# Patient Record
Sex: Female | Born: 1990 | Hispanic: Yes | Marital: Single | Smoking: Never smoker
Health system: Southern US, Community
[De-identification: ages and names within clinical notes are randomized; demographics above are authoritative.]

## PROBLEM LIST (undated history)

## (undated) ENCOUNTER — Emergency Department (HOSPITAL_BASED_OUTPATIENT_CLINIC_OR_DEPARTMENT_OTHER): Admission: EM | Payer: Self-pay | Source: Home / Self Care

## (undated) DIAGNOSIS — Z789 Other specified health status: Secondary | ICD-10-CM

## (undated) DIAGNOSIS — N736 Female pelvic peritoneal adhesions (postinfective): Secondary | ICD-10-CM

## (undated) HISTORY — PX: TUBAL LIGATION: SHX77

---
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
## 2012-11-01 ENCOUNTER — Inpatient Hospital Stay (HOSPITAL_COMMUNITY): Payer: Self-pay

## 2012-11-01 ENCOUNTER — Encounter (HOSPITAL_COMMUNITY): Payer: Self-pay | Admitting: *Deleted

## 2012-11-01 ENCOUNTER — Inpatient Hospital Stay (HOSPITAL_COMMUNITY)
Admission: AD | Admit: 2012-11-01 | Discharge: 2012-11-01 | Disposition: A | Discharge status: Home / Self Care | Payer: MEDICAID | Source: Ambulatory Visit | Attending: Obstetrics & Gynecology | Admitting: Obstetrics & Gynecology

## 2012-11-01 DIAGNOSIS — O26859 Spotting complicating pregnancy, unspecified trimester: Secondary | ICD-10-CM | POA: Insufficient documentation

## 2012-11-01 DIAGNOSIS — O209 Hemorrhage in early pregnancy, unspecified: Secondary | ICD-10-CM

## 2012-11-01 LAB — ABO/RH: ABO/RH(D): O POS

## 2012-11-01 LAB — CBC
MCHC: 32.9 g/dL (ref 30.0–36.0)
Platelets: 202 10*3/uL (ref 150–400)
RDW: 12.9 % (ref 11.5–15.5)
WBC: 10 10*3/uL (ref 4.0–10.5)

## 2012-11-01 LAB — POCT PREGNANCY, URINE: Preg Test, Ur: POSITIVE — AB

## 2012-11-01 LAB — WET PREP, GENITAL: Yeast Wet Prep HPF POC: NONE SEEN

## 2012-11-01 LAB — HCG, QUANTITATIVE, PREGNANCY: hCG, Beta Chain, Quant, S: 41368 m[IU]/mL — ABNORMAL HIGH (ref <5)

## 2012-11-01 NOTE — MAU Provider Note (Signed)
No chief complaint on file.   Subjective Shelley Chen 22 y.o.G3P2002 at [redacted]w[redacted]d by LMP presents with onset today of first episode of red spotting, not enough to wear a pad.  bleeding. Has slight lower abdominal pain. Last intercourse 3 wk ago.  Denies abnormal vaginal discharge or irritation. No dysuria or hematuria. Desired pregnancy. Blood type: unknown Pertinent medical history: N/C Pertinent Ob/Gyn history: C/S x 2 in [place] Pertinent surgical history: no other surgeries    Objective   Filed Vitals:   11/01/12 1430  BP: 109/68  Pulse: 83  Temp: 98.2 F (36.8 C)  Resp: 18     Physical Exam General: WN/WD in NAD  Abdom: soft, NT Pelvic:External genitalia: normal; BUS neg             Spec: small amount dark blood in vault                      Cx nulliparous, no lesions, appears closed           Bimanual: Cx closed, long; no CMT                             Uterus anteverted, NT, 8 weeks size                             Adnexae non tender, no masses   Lab Results Results for orders placed during the hospital encounter of 11/01/12 (from the past 24 hour(s))  POCT PREGNANCY, URINE     Status: Abnormal   Collection Time   11/01/12  2:00 PM      Component Value Range   Preg Test, Ur POSITIVE (*) NEGATIVE  HCG, QUANTITATIVE, PREGNANCY     Status: Abnormal   Collection Time   11/01/12  2:45 PM      Component Value Range   hCG, Beta Chain, Sharene Butters, [place] [address] (*) <5 mIU/mL  CBC     Status: Normal   Collection Time   11/01/12  2:46 PM      Component Value Range   WBC 10.0  4.0 - 10.5 K/uL   RBC 4.50  3.87 - 5.11 MIL/uL   Hemoglobin 12.8  12.0 - 15.0 g/dL   HCT 19.1  47.8 - 29.5 %   MCV 86.4  78.0 - 100.0 fL   MCH 28.4  26.0 - 34.0 pg   MCHC 32.9  30.0 - 36.0 g/dL   RDW 62.1  30.8 - 65.7 %   Platelets 202  150 - 400 K/uL  WET PREP, GENITAL     Status: Abnormal   Collection Time   11/01/12  2:50 PM      Component Value Range   Yeast Wet Prep HPF POC NONE SEEN  NONE SEEN   Trich, Wet Prep NONE SEEN  NONE SEEN   Clue Cells Wet Prep HPF POC NONE SEEN  NONE SEEN   WBC, Wet Prep HPF POC FEW (*) NONE SEEN    Ultrasound [redacted]w[redacted]d size irregularly shaped GS, large empty YS, moderate SCH   Assessment No diagnosis found.   Plan    D/W Dr. Marice Potter: since pregnancy desired, repeat US in 1 wk. GC/CT sent Discharge with AVS on Early Pregnancy Bleeding, Threatened AB Continue current meds.     Karlena Luebke 11/01/2012 2:19 PM

## 2012-11-01 NOTE — MAU Note (Signed)
Lower Right QUAD pain, brown blood 2 wks ago, today color red when wiping  After voiding

## 2012-11-04 ENCOUNTER — Inpatient Hospital Stay (HOSPITAL_COMMUNITY)
Admission: AD | Admit: 2012-11-04 | Discharge: 2012-11-04 | Disposition: A | Discharge status: Hospice | Payer: Self-pay | Source: Ambulatory Visit | Attending: Obstetrics & Gynecology | Admitting: Obstetrics & Gynecology

## 2012-11-04 ENCOUNTER — Encounter (HOSPITAL_COMMUNITY): Payer: Self-pay | Admitting: *Deleted

## 2012-11-04 DIAGNOSIS — O039 Complete or unspecified spontaneous abortion without complication: Secondary | ICD-10-CM | POA: Insufficient documentation

## 2012-11-04 LAB — HCG, QUANTITATIVE, PREGNANCY: hCG, Beta Chain, Quant, S: 30483 m[IU]/mL — ABNORMAL HIGH (ref <5)

## 2012-11-04 MED ORDER — OXYCODONE-ACETAMINOPHEN 5-325 MG PO TABS: 2.0000 | ORAL_TABLET | ORAL | Status: DC | PRN

## 2012-11-04 MED ORDER — IBUPROFEN 600 MG PO TABS: 600.0000 mg | ORAL_TABLET | Freq: Four times a day (QID) | ORAL | Status: DC | PRN

## 2012-11-04 MED ORDER — PROMETHAZINE HCL 12.5 MG PO TABS: 12.5000 mg | ORAL_TABLET | Freq: Four times a day (QID) | ORAL | Status: DC | PRN

## 2012-11-04 MED ORDER — MISOPROSTOL 200 MCG PO TABS: ORAL_TABLET | ORAL | Status: DC

## 2012-11-04 NOTE — MAU Provider Note (Signed)
Attestation of Attending Supervision of Advanced Practitioner (CNM/NP): Evaluation and management procedures were performed by the Advanced Practitioner under my supervision and collaboration. I have reviewed the Advanced Practitioner's note and chart, and I agree with the management and plan.  Kalai Baca H. 10:46 PM   

## 2012-11-04 NOTE — MAU Note (Signed)
Patient states she started having slight bleeding and passing blood clots at 1500. Denies any pain.

## 2012-11-04 NOTE — MAU Provider Note (Signed)
History     CSN: [id number]  Arrival date and time: 11/04/12 1653   First Provider Initiated Contact with Patient 11/04/12 1920      Chief Complaint  Patient presents with  . Vaginal Bleeding   HPI [name] is a 22 y.o. G3P2002 at [redacted]w[redacted]d who presents to MAU today with complaint of vaginal bleeding. The patient was seen on 11/01/12 for the same complaint and had an [place] that showed an irregular GS and large yolk sac. It was discussed with the patient at that time that miscarriage was a strong possibility. The patient states that this was a desired pregnancy. She had bleeding that day and then no bleeding until earlier today. She denies abdominal cramping today, but has had significant vaginal bleeding with frequent clots. The patient denies fever, N/V.   OB History    Grav Para Term Preterm Abortions TAB SAB Ect Mult Living   3 2 2       2       History reviewed. No pertinent past medical history.  Past Surgical History  Procedure Date  . Cesarean section     History reviewed. No pertinent family history.  History  Substance Use Topics  . Smoking status: Never Smoker   . Smokeless tobacco: Not on file  . Alcohol Use: No    Allergies: No Known Allergies  No prescriptions prior to admission    ROS All negative unless otherwise noted in HPI Physical Exam   Blood pressure 103/54, pulse 69, temperature 98.3 F (36.8 C), temperature source Oral, resp. rate 16, last menstrual period 08/18/2012, SpO2 100.00%.  Physical Exam  Constitutional: She is oriented to person, place, and time. She appears well-developed and well-nourished.  HENT:  Head: Normocephalic.  Cardiovascular: Normal rate.   Respiratory: Effort normal.  Genitourinary: Vagina normal. Uterus is not enlarged and not tender. Cervix exhibits discharge (small amount of blood in the vaginal vault; clot visible at the cervical os). Cervix exhibits no motion tenderness and no friability.  Neurological: She  is alert and oriented to person, place, and time.  Skin: Skin is warm and dry. No erythema.   Results for orders placed during the hospital encounter of 11/04/12 (from the past 24 hour(s))  HCG, QUANTITATIVE, PREGNANCY     Status: Abnormal   Collection Time   11/04/12  6:01 PM      Component Value Range   hCG, Beta Chain, [place], [place] [address] (*) <5 mIU/mL    MAU Course  Procedures None  MDM Patient has had increased bleeding. IUGS and yolk sac seen on last [place]. They were irregular in shape and size. This is a poor prognostic factor. As this was a desired pregnancy the patient elected for expectant management at that time. Today she is still unsure of whether she wants to take cytotec. She is agreeable to a Rx sent to the pharmacy that she can take if she decides she wants to. Both options were discussed in detail. Patient will keep [place] appointment for Friday to determine in POC are still in uterus. Patient feels this would be helpful in her decision to take cytotec as well.        Early Intrauterine Pregnancy Failure  _x__  Documented intrauterine pregnancy failure less than or equal to [redacted] weeks gestation  _x__  No serious current illness  _x__  Baseline Hgb greater than or equal to 10g/dl  _x__  Patient has easily accessible transportation to the hospital  ___  Clear  preference - patient unsure today. Rx sent to pharmacy for future use if needed  _x__  Practitioner/physician deems patient reliable  _x__  Counseling by practitioner or physician  _x__  Patient education by RN  _x__  Consent form signed  ___  Rho-Gam given by RN if indicated - O pos  _x__ Medication dispensed   _x__   Cytotec 800 mcg  _x_   Intravaginally by patient at home         __   Intravaginally by RN in MAU        __   Rectally by patient at home        __   Rectally by RN in MAU  _x__  Ibuprofen 600 mg 1 tablet by mouth every 6 hours as needed #30  _x__  Hydrocodone/acetaminophen 5/325 mg by mouth  every 4 to 6 hours as needed  _x__  Phenergan 12.5 mg by mouth every 4 hours as needed for nausea    Assessment and Plan  A: SAB  P: Discharge patient Rx for cytotec, ibuprofen, phenergan, percocet given/sent to pharmacy Bleeding precautions discussed Patient will keep appointment for follow-up US on Friday to determine retained products and confirm SAB Patient will be contacted for follow-up appointment in [facility] clinic in ~2 weeks Patient may follow-up in MAU as needed or if her condition were to change or worsen  Freddi Starr, PA-C 11/04/2012, 7:33 PM

## 2012-11-08 ENCOUNTER — Other Ambulatory Visit (HOSPITAL_COMMUNITY): Payer: Self-pay | Admitting: Obstetrics and Gynecology

## 2012-11-08 ENCOUNTER — Ambulatory Visit (HOSPITAL_COMMUNITY)
Admission: RE | Admit: 2012-11-08 | Discharge: 2012-11-08 | Disposition: A | Discharge status: Home / Self Care | Payer: Self-pay | Source: Ambulatory Visit | Attending: Obstetrics and Gynecology | Admitting: Obstetrics and Gynecology

## 2012-11-08 ENCOUNTER — Inpatient Hospital Stay (HOSPITAL_COMMUNITY)
Admission: AD | Admit: 2012-11-08 | Discharge: 2012-11-08 | Disposition: A | Discharge status: Home / Self Care | Payer: Self-pay | Source: Ambulatory Visit | Attending: Obstetrics and Gynecology | Admitting: Obstetrics and Gynecology

## 2012-11-08 DIAGNOSIS — Z3689 Encounter for other specified antenatal screening: Secondary | ICD-10-CM | POA: Insufficient documentation

## 2012-11-08 DIAGNOSIS — O209 Hemorrhage in early pregnancy, unspecified: Secondary | ICD-10-CM

## 2012-11-08 DIAGNOSIS — O200 Threatened abortion: Secondary | ICD-10-CM | POA: Insufficient documentation

## 2012-11-08 DIAGNOSIS — O3680X0 Pregnancy with inconclusive fetal viability, not applicable or unspecified: Secondary | ICD-10-CM | POA: Insufficient documentation

## 2012-11-08 DIAGNOSIS — O039 Complete or unspecified spontaneous abortion without complication: Secondary | ICD-10-CM

## 2012-11-08 NOTE — MAU Provider Note (Signed)
Attestation of Attending Supervision of Advanced Practitioner (CNM/NP): Evaluation and management procedures were performed by the Advanced Practitioner under my supervision and collaboration.  I have reviewed the Advanced Practitioner's note and chart, and I agree with the management and plan.  Shelley Chen 11/08/2012 12:41 PM

## 2012-11-08 NOTE — MAU Note (Signed)
Patient to MAU after ultrasound. Patient states she is having a little low back pain and red spotting.

## 2012-11-08 NOTE — MAU Provider Note (Signed)
  History     CSN: [id number]  Arrival date and time: 11/08/12 1121   None     No chief complaint on file.  HPI Shelley Chen is 22 y.o. G3P2002 [redacted]w[redacted]d weeks presenting for follow up ultrasound.  She was initially seen 1/25 with spotting and lower abdominal pain in early pregnancy.  BHCG [id number].  U/S showed [redacted]w[redacted]d irregular shaped GS, large empty YS, Mod [facility]. This is a desired pregnancy.  She returned on 1/27 with vaginal bleeding.  On that date, BHCG dropped to 30,483.   BLOOD TYPE IS O +.  She states she was given prescriptions for Cytotec and pain meds but did not fill them because she wants to make sure this pregnancy would not progress.      No past medical history on file.  Past Surgical History  Procedure Date  . Cesarean section     No family history on file.  History  Substance Use Topics  . Smoking status: Never Smoker   . Smokeless tobacco: Not on file  . Alcohol Use: No    Allergies: No Known Allergies  Prescriptions prior to admission  Medication Sig Dispense Refill  . ibuprofen (ADVIL,MOTRIN) 600 MG tablet Take 1 tablet (600 mg total) by mouth every 6 (six) hours as needed for pain.  30 tablet  0  . misoprostol (CYTOTEC) 200 MCG tablet 4 tablets (800 mcg) inserted in the vagina once  4 tablet  0  . oxyCODONE-acetaminophen (PERCOCET/ROXICET) 5-325 MG per tablet Take 2 tablets by mouth every 4 (four) hours as needed for pain.  30 tablet  0  . promethazine (PHENERGAN) 12.5 MG tablet Take 1 tablet (12.5 mg total) by mouth every 6 (six) hours as needed for nausea.  30 tablet  0    Review of Systems  Constitutional: Negative.   Gastrointestinal: Positive for abdominal pain (cramping).  Genitourinary:       + for vaginal bleeding   Physical Exam   Last menstrual period 08/18/2012.  Physical Exam  Constitutional: She is oriented to person, place, and time. She appears well-developed and well-nourished. No distress.  HENT:  Head: Normocephalic.  Neck: Normal  range of motion.  Cardiovascular: Normal rate.   Respiratory: Effort normal.  Genitourinary:       Not indicated  Neurological: She is alert and oriented to person, place, and time.  Skin: Skin is warm and dry.  Psychiatric: She has a normal mood and affect. Her behavior is normal.        MAU Course  Procedures  MDM Discussed ultrasound report with the patient.  Again, given option for expected management vs Cytotec.  She want to now use cytotec tonight.  She has appointment 2/7 in the clinic that she plans to keep.   Assessment and Plan  A:  Inevitable miscarriage  P:  Patient has Rx for cytotec and pain med at home      Discussed that bleeding and cramping will increase as miscarriage progresses      Encouraged to return for severe abdominal pain or heavy bleeding     Keep appt for 2/7 for follow up  KEY,EVE M 11/08/2012, 11:26 AM

## 2012-11-09 ENCOUNTER — Encounter (HOSPITAL_COMMUNITY): Payer: Self-pay | Admitting: *Deleted

## 2012-11-09 ENCOUNTER — Inpatient Hospital Stay (HOSPITAL_COMMUNITY)
Admission: AD | Admit: 2012-11-09 | Discharge: 2012-11-09 | Disposition: A | Discharge status: Home / Self Care | Payer: MEDICAID | Source: Ambulatory Visit | Attending: Obstetrics and Gynecology | Admitting: Obstetrics and Gynecology

## 2012-11-09 DIAGNOSIS — O021 Missed abortion: Secondary | ICD-10-CM | POA: Insufficient documentation

## 2012-11-09 LAB — CBC
HCT: 33.5 % — ABNORMAL LOW (ref 36.0–46.0)
Hemoglobin: 10.9 g/dL — ABNORMAL LOW (ref 12.0–15.0)
MCH: 28.1 pg (ref 26.0–34.0)
MCHC: 32.5 g/dL (ref 30.0–36.0)
MCV: 86.3 fL (ref 78.0–100.0)
Platelets: 190 K/uL (ref 150–400)
RBC: 3.88 MIL/uL (ref 3.87–5.11)
RDW: 12.7 % (ref 11.5–15.5)
WBC: 10 K/uL (ref 4.0–10.5)

## 2012-11-09 MED ORDER — KETOROLAC TROMETHAMINE 60 MG/2ML IM SOLN
60.0000 mg | Freq: Once | INTRAMUSCULAR | Status: AC
  Administered 2012-11-09: 60 mg via INTRAMUSCULAR
  Filled 2012-11-09: qty 2

## 2012-11-09 MED ORDER — ONDANSETRON 8 MG PO TBDP
8.0000 mg | ORAL_TABLET | Freq: Once | ORAL | Status: AC
  Administered 2012-11-09: 8 mg via ORAL
  Filled 2012-11-09: qty 1

## 2012-11-09 NOTE — MAU Provider Note (Signed)
Attestation of Attending Supervision of Advanced Practitioner (CNM/NP): Evaluation and management procedures were performed by the Advanced Practitioner under my supervision and collaboration.  I have reviewed the Advanced Practitioner's note and chart, and I agree with the management and plan.  Monna Crean 11/09/2012 6:54 AM

## 2012-11-09 NOTE — MAU Provider Note (Signed)
History     CSN: [id number]  Arrival date and time: 11/09/12 4098   First Provider Initiated Contact with Patient 11/09/12 [phone]      Chief Complaint  Patient presents with  . Abdominal Pain   HPI 22 y.o. [id number] with 6 week missed AB. Seen yesterday in MAU for f/u u/s confirming failed IUP. Pt took Cytotec last night around 7 PM, has been having heavy bleeding intermittently. Severe abd pain since midnight. Has taken Motrin 600 mg and 2 percocet x 2 doses tonight. Here d/t uncontrolled pain.   History reviewed. No pertinent past medical history.  Past Surgical History  Procedure Date  . Cesarean section     History reviewed. No pertinent family history.  History  Substance Use Topics  . Smoking status: Never Smoker   . Smokeless tobacco: Not on file  . Alcohol Use: No    Allergies: No Known Allergies  Prescriptions prior to admission  Medication Sig Dispense Refill  . ibuprofen (ADVIL,MOTRIN) 600 MG tablet Take 1 tablet (600 mg total) by mouth every 6 (six) hours as needed for pain.  30 tablet  0  . oxyCODONE-acetaminophen (PERCOCET/ROXICET) 5-325 MG per tablet Take 2 tablets by mouth every 4 (four) hours as needed for pain.  30 tablet  0  . promethazine (PHENERGAN) 12.5 MG tablet Take 1 tablet (12.5 mg total) by mouth every 6 (six) hours as needed for nausea.  30 tablet  0  . [DISCONTINUED] misoprostol (CYTOTEC) 200 MCG tablet 4 tablets (800 mcg) inserted in the vagina once  4 tablet  0    Review of Systems  Constitutional: Negative.   Respiratory: Negative.   Cardiovascular: Negative.   Gastrointestinal: Positive for nausea, vomiting and abdominal pain. Negative for diarrhea and constipation.  Genitourinary: Negative for dysuria, urgency, frequency, hematuria and flank pain.       Positive vaginal bleeding   Musculoskeletal: Negative.   Neurological: Negative.   Psychiatric/Behavioral: Negative.    Physical Exam   Blood pressure 106/53, pulse 84, resp. rate  16, height 5\' 6"  (1.676 m), weight 144 lb 6 oz (65.488 kg), last menstrual period 08/18/2012.  Physical Exam  Nursing note and vitals reviewed. Constitutional: She is oriented to person, place, and time. She appears well-developed and well-nourished. She appears distressed.  Cardiovascular: Normal rate.   Respiratory: Effort normal.  GI: Soft. There is no tenderness.  Genitourinary: Cervix exhibits motion tenderness. There is bleeding (large) around the vagina.       Cervix closed. Large bleeding upon insertion of speculum, but bleeding completely stopped after vagina cleared of blood and clots.    Musculoskeletal: Normal range of motion.  Neurological: She is alert and oriented to person, place, and time.  Skin: Skin is warm and dry.  Psychiatric: She has a normal mood and affect.    MAU Course  Procedures Results for orders placed during the hospital encounter of 11/09/12 (from the past 24 hour(s))  CBC     Status: Abnormal   Collection Time   11/09/12  4:24 AM      Component Value Range   WBC 10.0  4.0 - 10.5 K/uL   RBC 3.88  3.87 - 5.11 MIL/uL   Hemoglobin 10.9 (*) 12.0 - 15.0 g/dL   HCT 21.3 (*) 08.6 - 57.8 %   MCV 86.3  78.0 - 100.0 fL   MCH 28.1  26.0 - 34.0 pg   MCHC 32.5  30.0 - 36.0 g/dL   RDW 46.9  62.9 -  15.5 %   Platelets 190  150 - 400 K/uL  '   Assessment and Plan   1. Missed ab       Medication List     As of 11/09/2012  5:02 AM    CONTINUE taking these medications         ibuprofen 600 MG tablet   Commonly known as: ADVIL,MOTRIN   Take 1 tablet (600 mg total) by mouth every 6 (six) hours as needed for pain.      oxyCODONE-acetaminophen 5-325 MG per tablet   Commonly known as: PERCOCET/ROXICET   Take 2 tablets by mouth every 4 (four) hours as needed for pain.      promethazine 12.5 MG tablet   Commonly known as: PHENERGAN   Take 1 tablet (12.5 mg total) by mouth every 6 (six) hours as needed for nausea.      STOP taking these medications          misoprostol 200 MCG tablet   Commonly known as: CYTOTEC            Follow-up Information    Follow up with [facility] On 11/15/2012.   Contact information:   [address] [place] [address] [phone]           Kiefer Opheim 11/09/2012, 5:01 AM

## 2012-11-09 NOTE — MAU Note (Signed)
Patient complains of severe abdominal pain since midnight. Vomiting x1.

## 2012-11-12 ENCOUNTER — Encounter (HOSPITAL_COMMUNITY): Payer: Self-pay | Admitting: *Deleted

## 2012-11-12 ENCOUNTER — Ambulatory Visit: Admit: 2012-11-12 | Payer: Self-pay | Admitting: Family Medicine

## 2012-11-12 ENCOUNTER — Inpatient Hospital Stay (HOSPITAL_COMMUNITY): Payer: Medicaid Other | Admitting: Anesthesiology

## 2012-11-12 ENCOUNTER — Ambulatory Visit (HOSPITAL_COMMUNITY)
Admission: AD | Admit: 2012-11-12 | Discharge: 2012-11-12 | Disposition: A | Discharge status: Home / Self Care | Payer: Medicaid Other | Source: Ambulatory Visit | Attending: Family Medicine | Admitting: Family Medicine

## 2012-11-12 ENCOUNTER — Encounter (HOSPITAL_COMMUNITY): Payer: Self-pay | Admitting: Anesthesiology

## 2012-11-12 ENCOUNTER — Encounter (HOSPITAL_COMMUNITY)
Admission: AD | Disposition: A | Discharge status: Home / Self Care | Payer: Self-pay | Source: Ambulatory Visit | Attending: Family Medicine

## 2012-11-12 ENCOUNTER — Inpatient Hospital Stay (HOSPITAL_COMMUNITY): Payer: Medicaid Other

## 2012-11-12 DIAGNOSIS — O0339 Incomplete spontaneous abortion with other complications: Secondary | ICD-10-CM | POA: Diagnosis present

## 2012-11-12 DIAGNOSIS — O021 Missed abortion: Principal | ICD-10-CM | POA: Insufficient documentation

## 2012-11-12 DIAGNOSIS — D62 Acute posthemorrhagic anemia: Secondary | ICD-10-CM | POA: Diagnosis present

## 2012-11-12 HISTORY — DX: Other specified health status: Z78.9

## 2012-11-12 HISTORY — PX: DILATION AND EVACUATION: SHX1459

## 2012-11-12 LAB — CBC
HCT: 30.7 % — ABNORMAL LOW (ref 36.0–46.0)
Hemoglobin: 9.9 g/dL — ABNORMAL LOW (ref 12.0–15.0)
MCV: 87 fL (ref 78.0–100.0)
RDW: 12.8 % (ref 11.5–15.5)
WBC: 9.8 10*3/uL (ref 4.0–10.5)

## 2012-11-12 LAB — URINALYSIS, ROUTINE W REFLEX MICROSCOPIC
Bilirubin Urine: NEGATIVE
Nitrite: NEGATIVE
Specific Gravity, Urine: 1.015 (ref 1.005–1.030)
pH: 6.5 (ref 5.0–8.0)

## 2012-11-12 LAB — URINE MICROSCOPIC-ADD ON: WBC, UA: NONE SEEN WBC/hpf (ref <3)

## 2012-11-12 SURGERY — DILATION AND EVACUATION, UTERUS
Anesthesia: Monitor Anesthesia Care | Site: Vagina | Wound class: Clean Contaminated

## 2012-11-12 MED ORDER — LACTATED RINGERS IV SOLN
INTRAVENOUS | Status: DC
  Administered 2012-11-12: 1000 mL via INTRAVENOUS

## 2012-11-12 MED ORDER — FENTANYL CITRATE 0.05 MG/ML IJ SOLN: 25.0000 ug | INTRAMUSCULAR | Status: DC | PRN

## 2012-11-12 MED ORDER — CITRIC ACID-SODIUM CITRATE 334-500 MG/5ML PO SOLN
30.0000 mL | Freq: Once | ORAL | Status: AC
  Administered 2012-11-12: 30 mL via ORAL
  Filled 2012-11-12: qty 15

## 2012-11-12 MED ORDER — KETOROLAC TROMETHAMINE 30 MG/ML IJ SOLN
INTRAMUSCULAR | Status: DC | PRN
  Administered 2012-11-12: 30 mg via INTRAVENOUS

## 2012-11-12 MED ORDER — LIDOCAINE HCL (CARDIAC) 20 MG/ML IV SOLN
INTRAVENOUS | Status: DC | PRN
  Administered 2012-11-12: 50 mg via INTRAVENOUS

## 2012-11-12 MED ORDER — DOXYCYCLINE HYCLATE 100 MG PO CAPS: 100.0000 mg | ORAL_CAPSULE | Freq: Two times a day (BID) | ORAL | Status: DC

## 2012-11-12 MED ORDER — HYDROMORPHONE HCL PF 1 MG/ML IJ SOLN
0.5000 mg | Freq: Once | INTRAMUSCULAR | Status: AC
  Administered 2012-11-12: 0.5 mg via INTRAVENOUS
  Filled 2012-11-12: qty 1

## 2012-11-12 MED ORDER — PROPOFOL 10 MG/ML IV BOLUS
INTRAVENOUS | Status: DC | PRN
  Administered 2012-11-12: 50 mg via INTRAVENOUS
  Administered 2012-11-12: 30 mg via INTRAVENOUS
  Administered 2012-11-12 (×2): 20 mg via INTRAVENOUS

## 2012-11-12 MED ORDER — MEPERIDINE HCL 25 MG/ML IJ SOLN
12.5000 mg | Freq: Once | INTRAMUSCULAR | Status: AC
  Administered 2012-11-12: 12.5 mg via INTRAVENOUS

## 2012-11-12 MED ORDER — DOXYCYCLINE HYCLATE 100 MG IV SOLR
100.0000 mg | INTRAVENOUS | Status: DC
  Filled 2012-11-12: qty 100

## 2012-11-12 MED ORDER — BUPIVACAINE-EPINEPHRINE 0.25% -1:200000 IJ SOLN
INTRAMUSCULAR | Status: DC | PRN
  Administered 2012-11-12: 20 mL

## 2012-11-12 MED ORDER — MEPERIDINE HCL 25 MG/ML IJ SOLN
INTRAMUSCULAR | Status: AC
  Filled 2012-11-12: qty 1

## 2012-11-12 MED ORDER — LACTATED RINGERS IV BOLUS (SEPSIS)
1000.0000 mL | Freq: Once | INTRAVENOUS | Status: AC
  Administered 2012-11-12: 1000 mL via INTRAVENOUS

## 2012-11-12 MED ORDER — ONDANSETRON HCL 4 MG/2ML IJ SOLN
INTRAMUSCULAR | Status: DC | PRN
  Administered 2012-11-12: 4 mg via INTRAVENOUS

## 2012-11-12 MED ORDER — PROMETHAZINE HCL 25 MG/ML IJ SOLN
12.5000 mg | Freq: Once | INTRAMUSCULAR | Status: AC
  Administered 2012-11-12: 12.5 mg via INTRAVENOUS
  Filled 2012-11-12: qty 1

## 2012-11-12 MED ORDER — LACTATED RINGERS IV SOLN
INTRAVENOUS | Status: DC | PRN
  Administered 2012-11-12: 19:00:00 via INTRAVENOUS

## 2012-11-12 MED ORDER — DOXYCYCLINE HYCLATE 100 MG IV SOLR
100.0000 mg | Freq: Once | INTRAVENOUS | Status: AC
  Administered 2012-11-12: 100 mg via INTRAVENOUS
  Filled 2012-11-12: qty 100

## 2012-11-12 MED ORDER — FAMOTIDINE IN NACL 20-0.9 MG/50ML-% IV SOLN
20.0000 mg | Freq: Once | INTRAVENOUS | Status: AC
  Administered 2012-11-12: 20 mg via INTRAVENOUS
  Filled 2012-11-12: qty 50

## 2012-11-12 MED ORDER — FENTANYL CITRATE 0.05 MG/ML IJ SOLN
INTRAMUSCULAR | Status: DC | PRN
  Administered 2012-11-12: 50 ug via INTRAVENOUS

## 2012-11-12 MED ORDER — MIDAZOLAM HCL 5 MG/5ML IJ SOLN
INTRAMUSCULAR | Status: DC | PRN
  Administered 2012-11-12: 2 mg via INTRAVENOUS

## 2012-11-12 SURGICAL SUPPLY — 22 items
CATH ROBINSON RED A/P 16FR (CATHETERS) ×2 IMPLANT
CLOTH BEACON ORANGE TIMEOUT ST (SAFETY) ×2 IMPLANT
DECANTER SPIKE VIAL GLASS SM (MISCELLANEOUS) IMPLANT
GLOVE BIOGEL PI IND STRL 7.0 (GLOVE) ×1 IMPLANT
GLOVE BIOGEL PI INDICATOR 7.0 (GLOVE) ×1
GLOVE ECLIPSE 7.0 STRL STRAW (GLOVE) ×4 IMPLANT
GOWN PREVENTION PLUS XLARGE (GOWN DISPOSABLE) ×2 IMPLANT
GOWN STRL REIN XL XLG (GOWN DISPOSABLE) ×4 IMPLANT
KIT BERKELEY 1ST TRIMESTER 3/8 (MISCELLANEOUS) ×2 IMPLANT
NEEDLE SPNL 22GX3.5 QUINCKE BK (NEEDLE) ×2 IMPLANT
NS IRRIG 1000ML POUR BTL (IV SOLUTION) ×2 IMPLANT
PACK VAGINAL MINOR WOMEN LF (CUSTOM PROCEDURE TRAY) ×2 IMPLANT
PAD OB MATERNITY 4.3X12.25 (PERSONAL CARE ITEMS) ×2 IMPLANT
PAD PREP 24X48 CUFFED NSTRL (MISCELLANEOUS) ×2 IMPLANT
SET BERKELEY SUCTION TUBING (SUCTIONS) ×2 IMPLANT
SYR CONTROL 10ML LL (SYRINGE) ×2 IMPLANT
TOWEL OR 17X24 6PK STRL BLUE (TOWEL DISPOSABLE) ×4 IMPLANT
VACURETTE 10 RIGID CVD (CANNULA) IMPLANT
VACURETTE 12 RIGID CVD (CANNULA) ×2 IMPLANT
VACURETTE 7MM CVD STRL WRAP (CANNULA) IMPLANT
VACURETTE 8 RIGID CVD (CANNULA) IMPLANT
VACURETTE 9 RIGID CVD (CANNULA) IMPLANT

## 2012-11-12 NOTE — MAU Note (Signed)
Pt states via Eda, spanish translator who states she miscarried last Monday, rcvd cytotec, Friday through today has been bleeding heavily. Now only spotting. Rates lower abd pain 10, has burning with voiding. Pain is lower abdominal and radiates to her back.

## 2012-11-12 NOTE — Anesthesia Preprocedure Evaluation (Addendum)
Anesthesia Evaluation  Patient identified by MRN, date of birth, ID band Patient awake    Reviewed: Allergy & Precautions, H&P , Patient's Chart, lab work & pertinent test results, reviewed documented beta blocker date and time   Airway Mallampati: II TM Distance: >3 FB Neck ROM: full    Dental No notable dental hx.    Pulmonary  breath sounds clear to auscultation  Pulmonary exam normal       Cardiovascular Rhythm:regular Rate:Normal     Neuro/Psych    GI/Hepatic   Endo/Other    Renal/GU      Musculoskeletal   Abdominal   Peds  Hematology   Anesthesia Other Findings   Reproductive/Obstetrics                           Anesthesia Physical Anesthesia Plan  ASA: II  Anesthesia Plan: MAC   Post-op Pain Management:    Induction: Intravenous  Airway Management Planned: Mask  Additional Equipment:   Intra-op Plan:   Post-operative Plan:   Informed Consent: I have reviewed the patients History and Physical, chart, labs and discussed the procedure including the risks, benefits and alternatives for the proposed anesthesia with the patient or authorized representative who has indicated his/her understanding and acceptance.   Dental Advisory Given  Plan Discussed with: CRNA and Surgeon  Anesthesia Plan Comments: (  Discussed  general anesthesia, including possible nausea, instrumentation of airway, sore throat,pulmonary aspiration, etc. I asked if the were any outstanding questions, or  concerns before we proceeded. )        Anesthesia Quick Evaluation

## 2012-11-12 NOTE — MAU Note (Signed)
Pt states she started having a miscarriage last Monday & came to MAU.  Returned for [employer] & U/S on Friday, was given Cytotec.  Pt took the cytotec Friday night & came back to MAU during the night due to extreme pain & nausea.  She was told she had not passed the pregnancy yet.  Pt continues to have severe lower abd pain & was bleeding until this morning.  Pt states the pain med has not helped, last took one @ 1300.

## 2012-11-12 NOTE — H&P (Signed)
Chief Complaint  Patient presents with  . Abdominal Pain   HPI [name] is a 22 y.o. [id number] who presents to MAU today with complaint of severe abdominal pain, N/V and excessive vaginal bleeding with clots. The patient took cytotec last Friday and was evaluated on Saturday as well with abdominal pain and N/V. She was given IM Toradol and ODT Zofran at that time, which she states helped somewhat. She has continued to have heavy bleeding and severe lower abdominal pain uncontrolled by percocet or ibuprofen. She has had N/V today. She is complaining of pain in the lower abdomen at the midline. She denies fever, chills, UTI symptoms.   OB History    Grav Para Term Preterm Abortions TAB SAB Ect Mult Living   3 2 2       2       Past Medical History  Diagnosis Date  . No pertinent past medical history     Past Surgical History  Procedure Date  . Cesarean section     C/S x 2    History reviewed. No pertinent family history.  History  Substance Use Topics  . Smoking status: Never Smoker   . Smokeless tobacco: Not on file  . Alcohol Use: No    Allergies: No Known Allergies  Prescriptions prior to admission  Medication Sig Dispense Refill  . ibuprofen (ADVIL,MOTRIN) 600 MG tablet Take 1 tablet (600 mg total) by mouth every 6 (six) hours as needed for pain.  30 tablet  0  . promethazine (PHENERGAN) 12.5 MG tablet Take 1 tablet (12.5 mg total) by mouth every 6 (six) hours as needed for nausea.  30 tablet  0    Review of Systems  Constitutional: Negative for fever and chills.  Cardiovascular: Negative for chest pain.  Gastrointestinal: Positive for nausea, vomiting and abdominal pain. Negative for diarrhea.  Genitourinary: Negative for dysuria, urgency and frequency.       Positive for vaginal bleeding with clots  Neurological: Negative for dizziness.   Physical Exam   Blood pressure 112/63, pulse 74, temperature 98.8 F (37.1 C), temperature source Oral, resp.  rate 16, last menstrual period 08/18/2012, SpO2 100.00%.  Physical Exam  Constitutional: She is oriented to person, place, and time. She appears well-developed and well-nourished. She appears ill (pale complexion and appears uncomfortable). No distress.  HENT:  Head: Normocephalic.  Cardiovascular: Normal rate.   Respiratory: Effort normal.  GI: Soft. She exhibits no distension and no mass. There is tenderness (moderate tenderness to palpation of the lower abdomen more prominent at midline). There is no rebound and no guarding.  Genitourinary: Vagina normal. Uterus is enlarged and tender. Cervix exhibits discharge (moderate amount of bleeding noted at the cervical os and in vaginal vault with medium-sized clots). Cervix exhibits no motion tenderness and no friability. Right adnexum displays no mass and no tenderness. Left adnexum displays no mass and no tenderness.  Neurological: She is alert and oriented to person, place, and time.  Skin: Skin is warm and dry. No erythema.  Psychiatric: She has a normal mood and affect.   Results for orders placed during the hospital encounter of 11/12/12 (from the past 24 hour(s))  URINALYSIS, ROUTINE W REFLEX MICROSCOPIC     Status: Abnormal   Collection Time   11/12/12  2:17 PM      Component Value Range   Color, Urine YELLOW  YELLOW   APPearance CLEAR  CLEAR   Specific Gravity, Urine 1.015  1.005 - 1.030  pH 6.5  5.0 - 8.0   Glucose, UA NEGATIVE  NEGATIVE mg/dL   Hgb urine dipstick LARGE (*) NEGATIVE   Bilirubin Urine NEGATIVE  NEGATIVE   Ketones, ur NEGATIVE  NEGATIVE mg/dL   Protein, ur NEGATIVE  NEGATIVE mg/dL   Urobilinogen, UA 0.2  0.0 - 1.0 mg/dL   Nitrite NEGATIVE  NEGATIVE   Leukocytes, UA NEGATIVE  NEGATIVE  URINE MICROSCOPIC-ADD ON     Status: Normal   Collection Time   11/12/12  2:17 PM      Component Value Range   Squamous Epithelial / LPF RARE  RARE   WBC, UA    <3 WBC/hpf   Value: NO FORMED ELEMENTS SEEN ON URINE MICROSCOPIC  EXAMINATION   RBC / HPF 21-50  <3 RBC/hpf   Bacteria, UA RARE  RARE  CBC     Status: Abnormal   Collection Time   11/12/12  3:57 PM      Component Value Range   WBC 9.8  4.0 - 10.5 K/uL   RBC 3.53 (*) 3.87 - 5.11 MIL/uL   Hemoglobin 9.9 (*) 12.0 - 15.0 g/dL   HCT 21.3 (*) 08.6 - 57.8 %   MCV 87.0  78.0 - 100.0 fL   MCH 28.0  26.0 - 34.0 pg   MCHC 32.2  30.0 - 36.0 g/dL   RDW 46.9  62.9 - 52.8 %   Platelets 184  150 - 400 K/uL   *RADIOLOGY REPORT*   Clinical Data: Pain, bleeding. Question of retained products of  conception. Quantitative beta HCG was not ordered. On 11/01/2012,  quantitative beta HCG is 41,368. On 11/04/2012, 41,324. By first  ultrasound, the patient would be 8 weeks 5 days. The patient was  given cytotec 4 days ago.   OBSTETRIC <14 WK [place] AND TRANSVAGINAL OB US   Technique: Both transabdominal and transvaginal ultrasound  examinations were performed for complete evaluation of the  gestation as well as the maternal uterus, adnexal regions, and  pelvic cul-de-sac. Transvaginal technique was performed to assess  early pregnancy.   Comparison: 11/08/2012, 11/01/2012  Intrauterine gestational sac: Present  Yolk sac: Present  Embryo: Not seen  Cardiac Activity: Not seen  MSD: 1.38 cm 6 w 1 d  Maternal uterus/adnexae:  The left ovary is 1.4 x 0.9 x 1.0 cm. The right ovary is not seen.  Exam is technically difficult because of overlying bowel gas.   IMPRESSION:  1. Lack of interval progression of the intrauterine structures.  Large yolk sac.  2. Lack of fetal pole 10 days following ultrasound showing a  gestational sac and yolk sac. Findings meet definitive criteria for  failed pregnancy. This recommendation follows SRU consensus  guidelines: Diagnostic Criteria for Nonviable Pregnancy Early in  the First Trimester. Malva Limes Med 2013; 401:0272-53.   Original Report Authenticated By: Norva Pavlov, M.D.   Assessment and Plan  A: Incomplete  miscarriage--retained POC, heavy bleeding Anemia  To OR for D&E   Ann Bohne S 11/12/2012 6:21 PM

## 2012-11-12 NOTE — Transfer of Care (Signed)
Immediate Anesthesia Transfer of Care Note  Patient: Shelley Chen  Procedure(s) Performed: Procedure(s) (LRB) with comments: DILATATION AND EVACUATION (N/A)  Patient Location: PACU  Anesthesia Type:MAC  Level of Consciousness: awake, alert  and oriented  Airway & Oxygen Therapy: Patient Spontanous Breathing  Post-op Assessment: Report given to PACU RN and Post -op Vital signs reviewed and stable  Post vital signs: Reviewed and stable  Complications: No apparent anesthesia complications

## 2012-11-12 NOTE — Op Note (Signed)
Shelley Chen  PROCEDURE DATE: 11/12/2012  PREOPERATIVE DIAGNOSIS:  9 week missed abortion.  POSTOPERATIVE DIAGNOSIS: The same.  PROCEDURE:    Suction Dilation and Evacuation.  SURGEON:  Chantalle Defilippo S  ANESTHESIA: Jiles Garter, MD  INDICATIONS: 22 y.o. [phone] with Incomplete AB at [redacted] weeks gestation, needing surgical completion. Received cytotec x 1 and has continued to pass a lot of clot, dropped her hgb, and still has retained POC by U/S.  Risks of surgery were discussed with the patient including but not limited to: bleeding which may require transfusion; infection which may require antibiotics; injury to uterus or surrounding organs;need for additional procedures including laparotomy or laparoscopy; possibility of intrauterine scarring which may impair future fertility; and other postoperative/anesthesia complications. Written informed consent was obtained.    FINDINGS:  A 12 wk size anteverted/midline/retroverted uterus, moderate amounts of products of conception, specimen sent to pathology.  ANESTHESIA:    Monitored intravenous sedation, paracervical block.  ESTIMATED BLOOD LOSS:  Less than 20 ml.  SPECIMENS:  Products of conception sent to pathology  COMPLICATIONS:  None immediate.  PROCEDURE DETAILS:  The patient received intravenous antibiotics while in the preoperative area.  She was then taken to the operating room where general anesthesia was administered and was found to be adequate.  After an adequate timeout was performed, she was placed in the dorsal lithotomy position and examined; then prepped and draped in the sterile manner.   Her bladder was catheterized for an unmeasured amount of clear, yellow urine. A vaginal speculum was then placed in the patient's vagina and a single tooth tenaculum was applied to the anterior lip of the cervix.  A paracervical block using 1% Lidocaine with Epinephrine was administered. The cervix was gently dilated to accommodate a 12 mm  suction curette that was gently advanced to the uterine fundus.  The suction device was then activated and curette slowly rotated to clear the uterus of products of conception.  A sharp curettage was then performed to confirm complete emptying of the uterus.There was minimal bleeding noted and the tenaculum removed with good hemostasis noted. The patient tolerated the procedure well.  The patient was taken to the recovery area in stable condition.  [name] S 11/12/2012 7:28 PM

## 2012-11-12 NOTE — MAU Provider Note (Signed)
History     CSN: [id number]  Arrival date and time: 11/12/12 1351   First Provider Initiated Contact with Patient 11/12/12 1517      Chief Complaint  Patient presents with  . Abdominal Pain   HPI [name] is a 22 y.o. [id number] who presents to MAU today with complaint of severe abdominal pain, N/V and excessive vaginal bleeding with clots. The patient took cytotec last Friday and was evaluated on Saturday as well with abdominal pain and N/V. She was given IM Toradol and ODT Zofran at that time, which she states helped somewhat. She has continued to have heavy bleeding and severe lower abdominal pain uncontrolled by percocet or ibuprofen. She has had N/V today. She is complaining of pain in the lower abdomen at the midline. She denies fever, chills, UTI symptoms.   OB History    Grav Para Term Preterm Abortions TAB SAB Ect Mult Living   3 2 2       2       Past Medical History  Diagnosis Date  . No pertinent past medical history     Past Surgical History  Procedure Date  . Cesarean section     C/S x 2    History reviewed. No pertinent family history.  History  Substance Use Topics  . Smoking status: Never Smoker   . Smokeless tobacco: Not on file  . Alcohol Use: No    Allergies: No Known Allergies  Prescriptions prior to admission  Medication Sig Dispense Refill  . ibuprofen (ADVIL,MOTRIN) 600 MG tablet Take 1 tablet (600 mg total) by mouth every 6 (six) hours as needed for pain.  30 tablet  0  . promethazine (PHENERGAN) 12.5 MG tablet Take 1 tablet (12.5 mg total) by mouth every 6 (six) hours as needed for nausea.  30 tablet  0    Review of Systems  Constitutional: Negative for fever and chills.  Cardiovascular: Negative for chest pain.  Gastrointestinal: Positive for nausea, vomiting and abdominal pain. Negative for diarrhea.  Genitourinary: Negative for dysuria, urgency and frequency.       Positive for vaginal bleeding with clots  Neurological:  Negative for dizziness.   Physical Exam   Blood pressure 112/63, pulse 74, temperature 98.8 F (37.1 C), temperature source Oral, resp. rate 16, last menstrual period 08/18/2012, SpO2 100.00%.  Physical Exam  Constitutional: She is oriented to person, place, and time. She appears well-developed and well-nourished. She appears ill (pale complexion and appears uncomfortable). No distress.  HENT:  Head: Normocephalic.  Cardiovascular: Normal rate.   Respiratory: Effort normal.  GI: Soft. She exhibits no distension and no mass. There is tenderness (moderate tenderness to palpation of the lower abdomen more prominent at midline). There is no rebound and no guarding.  Genitourinary: Vagina normal. Uterus is enlarged and tender. Cervix exhibits discharge (moderate amount of bleeding noted at the cervical os and in vaginal vault with medium-sized clots). Cervix exhibits no motion tenderness and no friability. Right adnexum displays no mass and no tenderness. Left adnexum displays no mass and no tenderness.  Neurological: She is alert and oriented to person, place, and time.  Skin: Skin is warm and dry. No erythema.  Psychiatric: She has a normal mood and affect.   Results for orders placed during the hospital encounter of 11/12/12 (from the past 24 hour(s))  URINALYSIS, ROUTINE W REFLEX MICROSCOPIC     Status: Abnormal   Collection Time   11/12/12  2:17 PM  Component Value Range   Color, Urine YELLOW  YELLOW   APPearance CLEAR  CLEAR   Specific Gravity, Urine 1.015  1.005 - 1.030   pH 6.5  5.0 - 8.0   Glucose, UA NEGATIVE  NEGATIVE mg/dL   Hgb urine dipstick LARGE (*) NEGATIVE   Bilirubin Urine NEGATIVE  NEGATIVE   Ketones, ur NEGATIVE  NEGATIVE mg/dL   Protein, ur NEGATIVE  NEGATIVE mg/dL   Urobilinogen, UA 0.2  0.0 - 1.0 mg/dL   Nitrite NEGATIVE  NEGATIVE   Leukocytes, UA NEGATIVE  NEGATIVE  URINE MICROSCOPIC-ADD ON     Status: Normal   Collection Time   11/12/12  2:17 PM       Component Value Range   Squamous Epithelial / LPF RARE  RARE   WBC, UA    <3 WBC/hpf   Value: NO FORMED ELEMENTS SEEN ON URINE MICROSCOPIC EXAMINATION   RBC / HPF 21-50  <3 RBC/hpf   Bacteria, UA RARE  RARE  CBC     Status: Abnormal   Collection Time   11/12/12  3:57 PM      Component Value Range   WBC 9.8  4.0 - 10.5 K/uL   RBC 3.53 (*) 3.87 - 5.11 MIL/uL   Hemoglobin 9.9 (*) 12.0 - 15.0 g/dL   HCT 16.1 (*) 09.6 - 04.5 %   MCV 87.0  78.0 - 100.0 fL   MCH 28.0  26.0 - 34.0 pg   MCHC 32.2  30.0 - 36.0 g/dL   RDW 40.9  81.1 - 91.4 %   Platelets 184  150 - 400 K/uL   *RADIOLOGY REPORT*   Clinical Data: Pain, bleeding. Question of retained products of  conception. Quantitative beta HCG was not ordered. On 11/01/2012,  quantitative beta HCG is 41,368. On 11/04/2012, 78,295. By first  ultrasound, the patient would be 8 weeks 5 days. The patient was  given cytotec 4 days ago.   OBSTETRIC <14 WK [place] AND TRANSVAGINAL OB US   Technique: Both transabdominal and transvaginal ultrasound  examinations were performed for complete evaluation of the  gestation as well as the maternal uterus, adnexal regions, and  pelvic cul-de-sac. Transvaginal technique was performed to assess  early pregnancy.   Comparison: 11/08/2012, 11/01/2012  Intrauterine gestational sac: Present  Yolk sac: Present  Embryo: Not seen  Cardiac Activity: Not seen  MSD: 1.38 cm 6 w 1 d  Maternal uterus/adnexae:  The left ovary is 1.4 x 0.9 x 1.0 cm. The right ovary is not seen.  Exam is technically difficult because of overlying bowel gas.   IMPRESSION:  1. Lack of interval progression of the intrauterine structures.  Large yolk sac.  2. Lack of fetal pole 10 days following ultrasound showing a  gestational sac and yolk sac. Findings meet definitive criteria for  failed pregnancy. This recommendation follows SRU consensus  guidelines: Diagnostic Criteria for Nonviable Pregnancy Early in  the First Trimester. Malva Limes Med 2013; 621:3086-57.   Original Report Authenticated By: Norva Pavlov, M.D.  MAU Course  Procedures None  MDM Discussed patient with Dr. Shawnie Pons. Will get IV fluid and IV Dilaudid for pain. Patient needs F/U US today to look for retained products and CBC to check Hgb.  Discussed [place] and CBC results with Dr. Shawnie Pons. She will come to MAU to discuss options with the patient.   Assessment and Plan  A: Incomplete miscarriage  P: Patient NPO Dr. Shawnie Pons to take pt to OR for D&E  Freddi Starr, PA-C 11/12/2012, 5:45 PM

## 2012-11-13 ENCOUNTER — Encounter (HOSPITAL_COMMUNITY): Payer: Self-pay | Admitting: Family Medicine

## 2012-11-13 NOTE — Anesthesia Postprocedure Evaluation (Signed)
Anesthesia Post Note  Patient: Shelley Chen  Procedure(s) Performed: Procedure(s) (LRB): DILATATION AND EVACUATION (N/A)  Anesthesia type: MAC  Patient location: PACU  Post pain: Pain level controlled  Post assessment: Post-op Vital signs reviewed  Last Vitals:  Filed Vitals:   11/12/12 2045  BP: 93/57  Pulse: 68  Temp: 37.1 C  Resp: 20    Post vital signs: Reviewed  Level of consciousness: sedated  Complications: No apparent anesthesia complications

## 2012-11-15 ENCOUNTER — Encounter: Payer: Self-pay | Admitting: Medical

## 2012-12-05 ENCOUNTER — Ambulatory Visit (INDEPENDENT_AMBULATORY_CARE_PROVIDER_SITE_OTHER): Payer: Medicaid Other | Admitting: Family Medicine

## 2012-12-05 ENCOUNTER — Encounter: Payer: Self-pay | Admitting: Family Medicine

## 2012-12-05 VITALS — BP 118/66 | HR 87 | Temp 98.8°F | Wt 143.5 lb

## 2012-12-05 DIAGNOSIS — R300 Dysuria: Secondary | ICD-10-CM

## 2012-12-05 LAB — POCT URINALYSIS DIP (DEVICE)
Ketones, ur: NEGATIVE mg/dL
Protein, ur: NEGATIVE mg/dL
Specific Gravity, Urine: >=1.03 (ref 1.005–1.030)
pH: 5.5 (ref 5.0–8.0)

## 2012-12-05 MED ORDER — SULFAMETHOXAZOLE-TRIMETHOPRIM 400-80 MG PO TABS: 1.0000 | ORAL_TABLET | Freq: Two times a day (BID) | ORAL | Status: DC

## 2012-12-05 NOTE — Progress Notes (Signed)
  Subjective:    Patient ID: Shelley Chen, female    DOB: 10/04/1991, 22 y.o.   MRN: [id number]  Urinary Tract Infection  This is a new problem. The current episode started in the past 7 days. The problem occurs every urination. The problem has been unchanged. The quality of the pain is described as burning. There has been no fever. She is sexually active. There is no history of pyelonephritis. Pertinent negatives include no chills, flank pain or hematuria. She has tried nothing for the symptoms. recent D and C for missed AB      Review of Systems  Constitutional: Negative for chills.  Genitourinary: Negative for hematuria and flank pain.       Objective:   Physical Exam  Vitals reviewed. Constitutional: She appears well-developed and well-nourished.  HENT:  Head: Normocephalic and atraumatic.  Eyes: No scleral icterus.  Neck: Neck supple.  Cardiovascular: Normal rate and regular rhythm.   Pulmonary/Chest: Effort normal. She has no wheezes.  Abdominal: Soft. There is tenderness (suprapubic).  Skin: Skin is warm and dry.   U/A shows Large blood, small leuks.       Assessment & Plan:

## 2012-12-05 NOTE — Assessment & Plan Note (Signed)
Will treat presumptively and check urine culture.  Return if no improvement.

## 2012-12-05 NOTE — Patient Instructions (Signed)
Urinary Tract Infection Urinary tract infections (UTIs) can develop anywhere along your urinary tract. Your urinary tract is your body's drainage system for removing wastes and extra water. Your urinary tract includes two kidneys, two ureters, a bladder, and a urethra. Your kidneys are a pair of bean-shaped organs. Each kidney is about the size of your fist. They are located below your ribs, one on each side of your spine. CAUSES Infections are caused by microbes, which are microscopic organisms, including fungi, viruses, and bacteria. These organisms are so small that they can only be seen through a microscope. Bacteria are the microbes that most commonly cause UTIs. SYMPTOMS  Symptoms of UTIs may vary by age and gender of the patient and by the location of the infection. Symptoms in young women typically include a frequent and intense urge to urinate and a painful, burning feeling in the bladder or urethra during urination. Older women and men are more likely to be tired, shaky, and weak and have muscle aches and abdominal pain. A fever may mean the infection is in your kidneys. Other symptoms of a kidney infection include pain in your back or sides below the ribs, nausea, and vomiting. DIAGNOSIS To diagnose a UTI, your caregiver will ask you about your symptoms. Your caregiver also will ask to provide a urine sample. The urine sample will be tested for bacteria and white blood cells. White blood cells are made by your body to help fight infection. TREATMENT  Typically, UTIs can be treated with medication. Because most UTIs are caused by a bacterial infection, they usually can be treated with the use of antibiotics. The choice of antibiotic and length of treatment depend on your symptoms and the type of bacteria causing your infection. HOME CARE INSTRUCTIONS  If you were prescribed antibiotics, take them exactly as your caregiver instructs you. Finish the medication even if you feel better after you  have only taken some of the medication.  Drink enough water and fluids to keep your urine clear or pale yellow.  Avoid caffeine, tea, and carbonated beverages. They tend to irritate your bladder.  Empty your bladder often. Avoid holding urine for long periods of time.  Empty your bladder before and after sexual intercourse.  After a bowel movement, women should cleanse from front to back. Use each tissue only once. SEEK MEDICAL CARE IF:   You have back pain.  You develop a fever.  Your symptoms do not begin to resolve within 3 days. SEEK IMMEDIATE MEDICAL CARE IF:   You have severe back pain or lower abdominal pain.  You develop chills.  You have nausea or vomiting.  You have continued burning or discomfort with urination. MAKE SURE YOU:   Understand these instructions.  Will watch your condition.  Will get help right away if you are not doing well or get worse. Document Released: 07/05/2005 Document Revised: 03/26/2012 Document Reviewed: 11/03/2011 Exit[employer] Patient Information 2013 [employer].  

## 2012-12-12 ENCOUNTER — Ambulatory Visit: Payer: Self-pay | Admitting: Family Medicine

## 2013-07-01 ENCOUNTER — Ambulatory Visit: Payer: No Typology Code available for payment source | Attending: Family Medicine | Admitting: Internal Medicine

## 2013-07-01 VITALS — BP 110/67 | HR 67 | Temp 98.2°F | Resp 18 | Wt 144.0 lb

## 2013-07-01 DIAGNOSIS — N898 Other specified noninflammatory disorders of vagina: Secondary | ICD-10-CM | POA: Insufficient documentation

## 2013-07-01 DIAGNOSIS — N926 Irregular menstruation, unspecified: Secondary | ICD-10-CM

## 2013-07-01 DIAGNOSIS — D649 Anemia, unspecified: Secondary | ICD-10-CM

## 2013-07-01 DIAGNOSIS — D509 Iron deficiency anemia, unspecified: Secondary | ICD-10-CM | POA: Insufficient documentation

## 2013-07-01 LAB — CBC WITH DIFFERENTIAL/PLATELET
Eosinophils Absolute: 0.8 10*3/uL — ABNORMAL HIGH (ref 0.0–0.7)
HCT: 35.3 % — ABNORMAL LOW (ref 36.0–46.0)
Hemoglobin: 11.5 g/dL — ABNORMAL LOW (ref 12.0–15.0)
Lymphs Abs: 2.3 10*3/uL (ref 0.7–4.0)
MCH: 26.1 pg (ref 26.0–34.0)
MCV: 80.2 fL (ref 78.0–100.0)
Monocytes Absolute: 0.5 10*3/uL (ref 0.1–1.0)
Monocytes Relative: 7 % (ref 3–12)
Neutrophils Relative %: 49 % (ref 43–77)
RBC: 4.4 MIL/uL (ref 3.87–5.11)

## 2013-07-01 LAB — POCT URINE PREGNANCY: Preg Test, Ur: NEGATIVE

## 2013-07-01 MED ORDER — FOLIC ACID 1 MG PO TABS: 1.0000 mg | ORAL_TABLET | Freq: Every day | ORAL | Status: DC

## 2013-07-01 MED ORDER — THERA VITAL M PO TABS: 1.0000 | ORAL_TABLET | Freq: Every day | ORAL | Status: DC

## 2013-07-01 NOTE — Progress Notes (Signed)
Patient states is having some abnormal vaginal bleed Dark  Blood for two days-no a tint mixed in States she may be pregnant not using any form of birth control

## 2013-07-01 NOTE — Progress Notes (Signed)
Patient ID: Shelley Chen, female   DOB: May 27, 1991, 22 y.o.   MRN: [id number] Patient Demographics  Shelley Chen, is a 22 y.o. female  WUJ:[id number]  NFA:[id number]  DOB - Mar 29, 1991  Chief Complaint  Patient presents with  . Vaginal Bleeding        Subjective:   Shelley Chen today is here to establish primary care.  Patient is a 22 year old female with no medical problems except chronic anemia, presented to the clinic to establish care. She states that her LMP was 06/05/2013 but she had vaginal spotting last week. She was concerned if she was pregnant and had a miscarriage. She took several pregnancy tests from over-the-counter which were negative. Patient had a miscarriage in February 2014, now wants to be pregnant again. She is not on birth control currently.   Patient has No headache, No chest pain, No abdominal pain - No Nausea, No new weakness tingling or numbness, No Cough - SOB  Objective:    Filed Vitals:   07/01/13 1533  BP: 110/67  Pulse: 67  Temp: 98.2 F (36.8 C)  Resp: 18  Weight: 144 lb (65.318 kg)  SpO2: 100%     ALLERGIES:  No Known Allergies  PAST MEDICAL HISTORY: Past Medical History  Diagnosis Date  . No pertinent past medical history     PAST SURGICAL HISTORY: Past Surgical History  Procedure Laterality Date  . Cesarean section      C/S x 2  . Dilation and evacuation  11/12/2012    Procedure: DILATATION AND EVACUATION;  Surgeon: Reva Bores, MD;  Location: [facility];  Service: Gynecology;  Laterality: N/A;    FAMILY HISTORY: History reviewed. No pertinent family history.  MEDICATIONS AT HOME: Prior to Admission medications   Medication Sig Start Date End Date Taking? Authorizing Provider  folic acid (FOLVITE) 1 MG tablet Take 1 tablet (1 mg total) by mouth daily. 07/01/13   Sanchez Hemmer Shelley Luo, MD  ibuprofen (ADVIL,MOTRIN) 600 MG tablet Take 1 tablet (600 mg total) by mouth every 6 (six) hours as needed for pain. 11/04/12   Freddi Starr,  PA-C  Multiple Vitamins-Minerals (MULTIVITAMIN) tablet Take 1 tablet by mouth daily. 07/01/13   Shelley Chen Shelley Luo, MD  promethazine (PHENERGAN) 12.5 MG tablet Take 1 tablet (12.5 mg total) by mouth every 6 (six) hours as needed for nausea. 11/04/12   Freddi Starr, PA-C  sulfamethoxazole-trimethoprim (SEPTRA) 400-80 MG per tablet Take 1 tablet by mouth 2 (two) times daily. 12/05/12   Reva Bores, MD    REVIEW OF SYSTEMS:  Constitutional:   No   Fevers, chills, fatigue.  HEENT:    No headaches, Sore throat,   Cardio-vascular: No chest pain,  Orthopnea, swelling in lower extremities, anasarca, palpitations  GI:  No abdominal pain, nausea, vomiting, diarrhea  Resp: No shortness of breath,  No coughing up of blood.No cough.No wheezing.  Skin:  no rash or lesions.  GU:  no dysuria, change in color of urine, no urgency or frequency.  No flank pain.  Musculoskeletal: No joint pain or swelling.  No decreased range of motion.  No back pain.  Psych: No change in mood or affect. No depression or anxiety.  No memory loss.   Exam  General appearance :Awake, alert, NAD, Speech Clear. HEENT: Atraumatic and Normocephalic, PERLA Neck: supple, no JVD. No cervical lymphadenopathy.  Chest: clear to auscultation bilaterally, no wheezing, rales or rhonchi CVS: S1 S2 regular, no murmurs.  Abdomen: soft, NBS, NT, ND, no  gaurding, rigidity or rebound. Extremities: No cyanosis, clubbing, B/L Lower Ext shows no edema,  Neurology: Awake alert, and oriented X 3, CN II-XII intact, Non focal Skin:No Rash or lesions Wounds: N/A    Data Review   Basic Metabolic Panel: No results found for this basename: NA, K, CL, CO2, GLUCOSE, BUN, CREATININE, CALCIUM, MG, PHOS,  in the last 168 hours Liver Function Tests: No results found for this basename: AST, ALT, ALKPHOS, BILITOT, PROT, ALBUMIN,  in the last 168 hours  CBC: No results found for this basename: WBC, NEUTROABS, HGB, HCT, MCV, PLT,  in the  last 168 hours ------------------------------------------------------------------------------------------------------------------ No results found for this basename: HGBA1C,  in the last 72 hours ------------------------------------------------------------------------------------------------------------------ No results found for this basename: CHOL, HDL, LDLCALC, TRIG, CHOLHDL, LDLDIRECT,  in the last 72 hours ------------------------------------------------------------------------------------------------------------------ No results found for this basename: TSH, T4TOTAL, FREET3, T3FREE, THYROIDAB,  in the last 72 hours ------------------------------------------------------------------------------------------------------------------ No results found for this basename: VITAMINB12, FOLATE, FERRITIN, TIBC, IRON, RETICCTPCT,  in the last 72 hours  Coagulation profile  No results found for this basename: INR, PROTIME,  in the last 168 hours    Assessment & Plan   Active Problems: Health screening and vaginal spotting - Will check TSH, CBC, POC urine pregnancy - Ambulatory referral to OB/GYN - She also needs Pap smear - Placed on multivitamin, folic acid 1 mg daily  Health screening : - Ambulatory referral to OB/GYN for Pap smear and for #1  - She declined flu shot   Follow-up in 6 months as needed. Followup CBC and TSH.   Lataisha Colan M.D. 07/01/2013, 3:54 PM

## 2013-07-02 ENCOUNTER — Ambulatory Visit: Payer: Medicaid Other | Admitting: Family Medicine

## 2013-08-14 ENCOUNTER — Encounter: Payer: No Typology Code available for payment source | Admitting: Family Medicine

## 2013-10-20 ENCOUNTER — Other Ambulatory Visit (HOSPITAL_COMMUNITY): Payer: Self-pay | Admitting: Physician Assistant

## 2013-10-20 DIAGNOSIS — Z3689 Encounter for other specified antenatal screening: Secondary | ICD-10-CM

## 2013-10-20 LAB — OB RESULTS CONSOLE HIV ANTIBODY (ROUTINE TESTING)
HIV: NONREACTIVE
HIV: NONREACTIVE
HIV: NONREACTIVE
HIV: NONREACTIVE

## 2013-10-20 LAB — OB RESULTS CONSOLE HEPATITIS B SURFACE ANTIGEN: HEP B S AG: NEGATIVE

## 2013-10-27 ENCOUNTER — Other Ambulatory Visit (HOSPITAL_COMMUNITY): Payer: Self-pay | Admitting: Physician Assistant

## 2013-10-27 ENCOUNTER — Ambulatory Visit (HOSPITAL_COMMUNITY)
Admission: RE | Admit: 2013-10-27 | Discharge: 2013-10-27 | Disposition: A | Discharge status: Home / Self Care | Payer: Medicaid Other | Source: Ambulatory Visit | Attending: Physician Assistant | Admitting: Physician Assistant

## 2013-10-27 DIAGNOSIS — Z3689 Encounter for other specified antenatal screening: Secondary | ICD-10-CM

## 2013-10-29 LAB — OB RESULTS CONSOLE ANTIBODY SCREEN: ANTIBODY SCREEN: NEGATIVE

## 2013-10-29 LAB — OB RESULTS CONSOLE RUBELLA ANTIBODY, IGM: RUBELLA: IMMUNE

## 2013-10-29 LAB — OB RESULTS CONSOLE ABO/RH: RH TYPE: POSITIVE

## 2013-10-29 LAB — OB RESULTS CONSOLE HIV ANTIBODY (ROUTINE TESTING): HIV: NONREACTIVE

## 2013-10-29 LAB — OB RESULTS CONSOLE GBS: GBS: POSITIVE

## 2013-11-10 ENCOUNTER — Ambulatory Visit (HOSPITAL_COMMUNITY): Payer: Medicaid Other

## 2013-12-15 ENCOUNTER — Other Ambulatory Visit (HOSPITAL_COMMUNITY): Payer: Self-pay | Admitting: Nurse Practitioner

## 2013-12-15 DIAGNOSIS — Z3689 Encounter for other specified antenatal screening: Secondary | ICD-10-CM

## 2013-12-19 ENCOUNTER — Ambulatory Visit (HOSPITAL_COMMUNITY)
Admission: RE | Admit: 2013-12-19 | Discharge: 2013-12-19 | Disposition: A | Discharge status: Home / Self Care | Payer: Medicaid Other | Source: Ambulatory Visit | Attending: Nurse Practitioner | Admitting: Nurse Practitioner

## 2013-12-19 DIAGNOSIS — O34219 Maternal care for unspecified type scar from previous cesarean delivery: Secondary | ICD-10-CM | POA: Insufficient documentation

## 2013-12-19 DIAGNOSIS — Z3689 Encounter for other specified antenatal screening: Secondary | ICD-10-CM | POA: Insufficient documentation

## 2014-03-17 IMAGING — US US OB TRANSVAGINAL
1 series · 13 of 28 positions shown · non-contrast
Comparison: none

[Series 1: us ob transvaginal · 47 acquisitions, 13 frames shown]
[im 2/47]
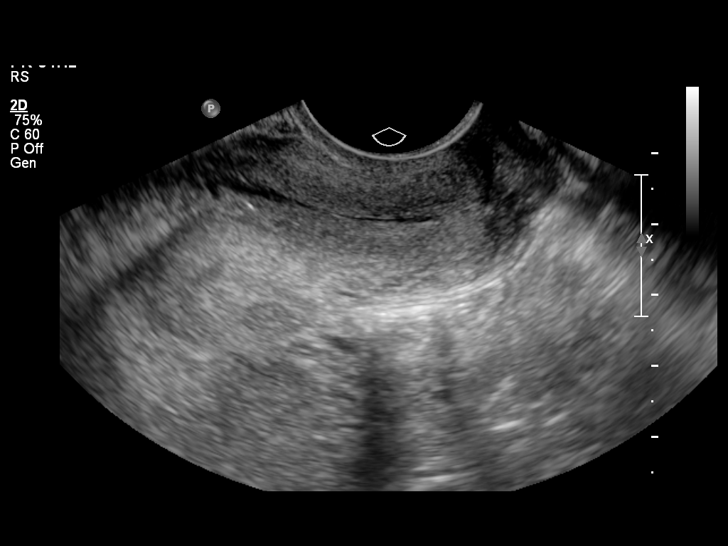
[im 6/47]
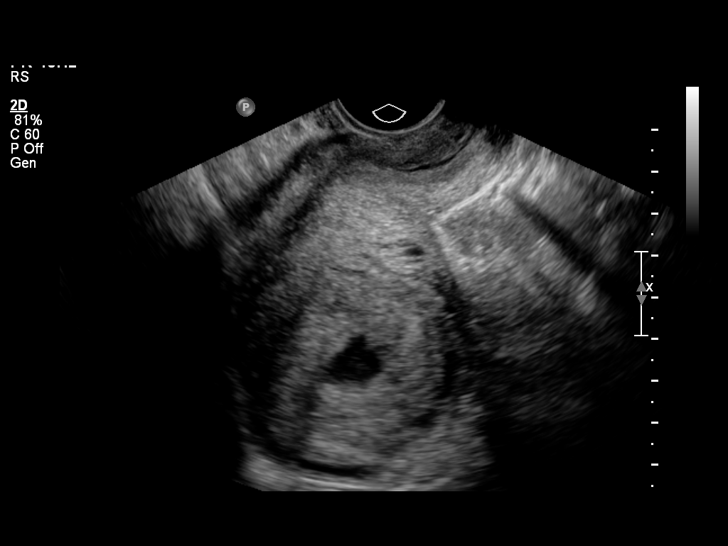
[im 9/47]
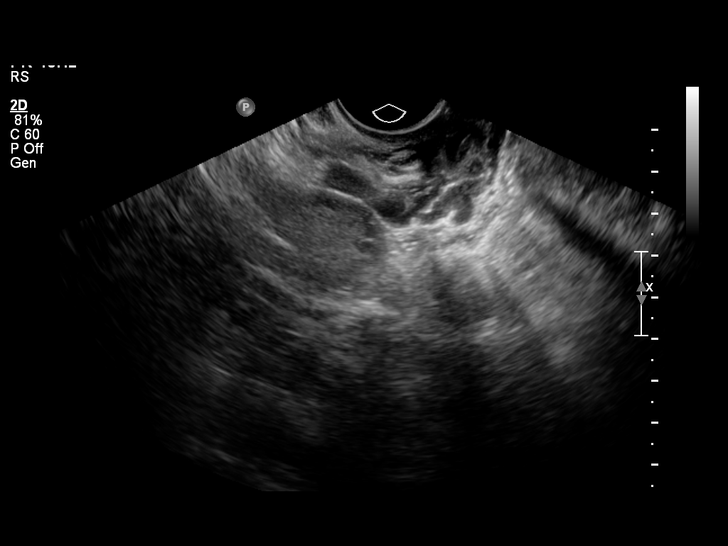
[im 12/47]
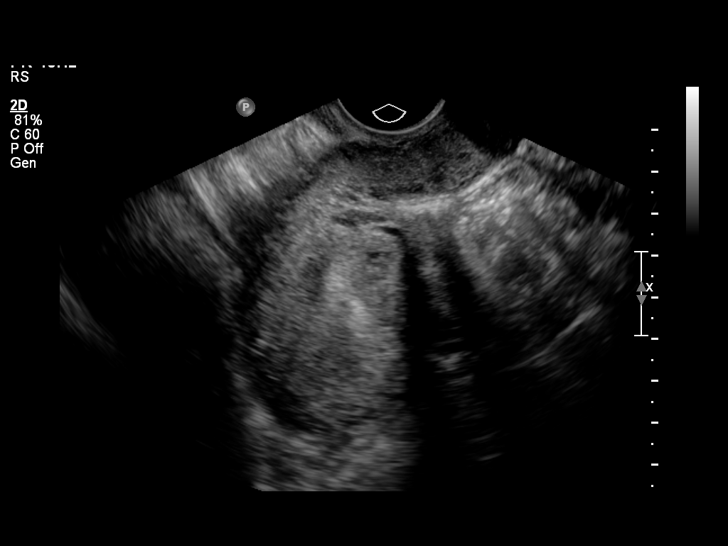
[im 16/47]
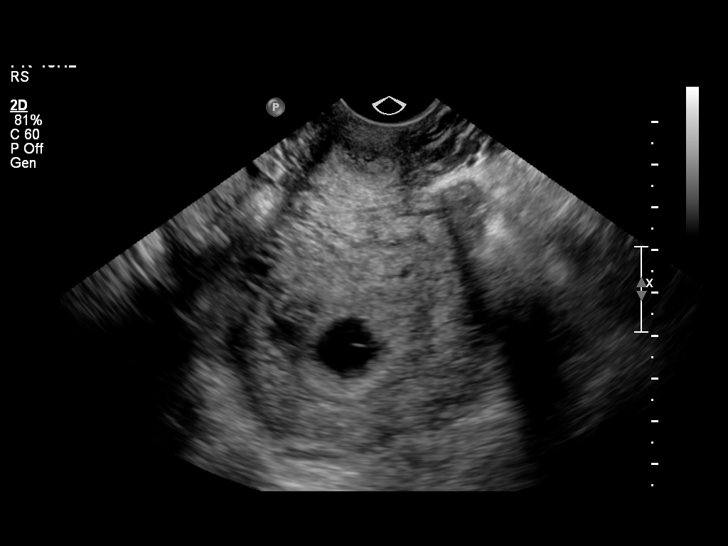
[im 19/47]
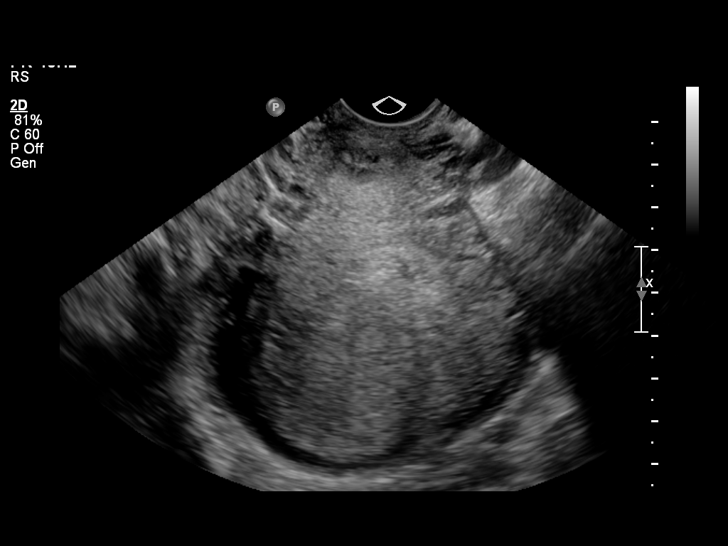
[im 24/47]
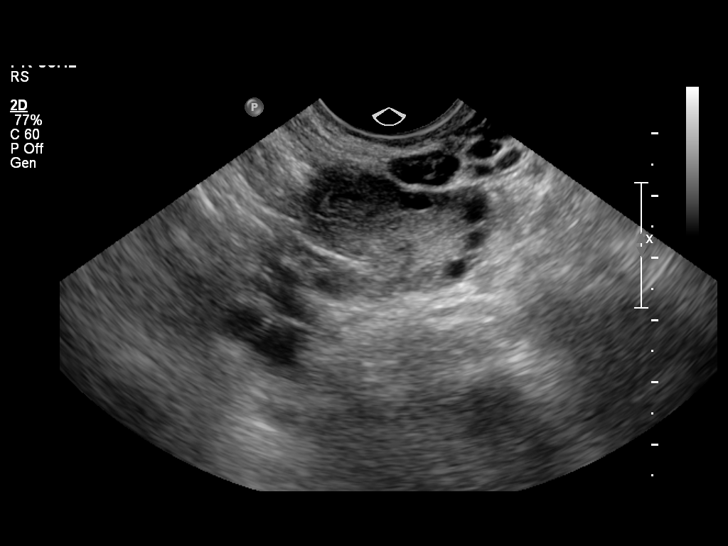
[im 28/47]
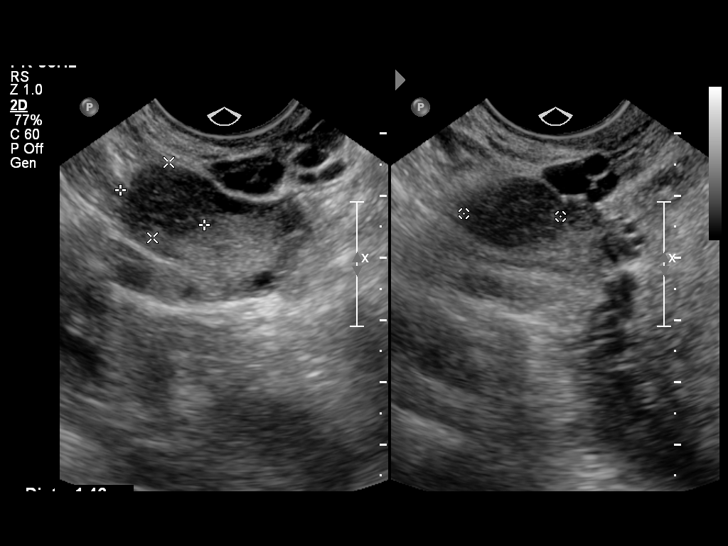
[im 31/47]
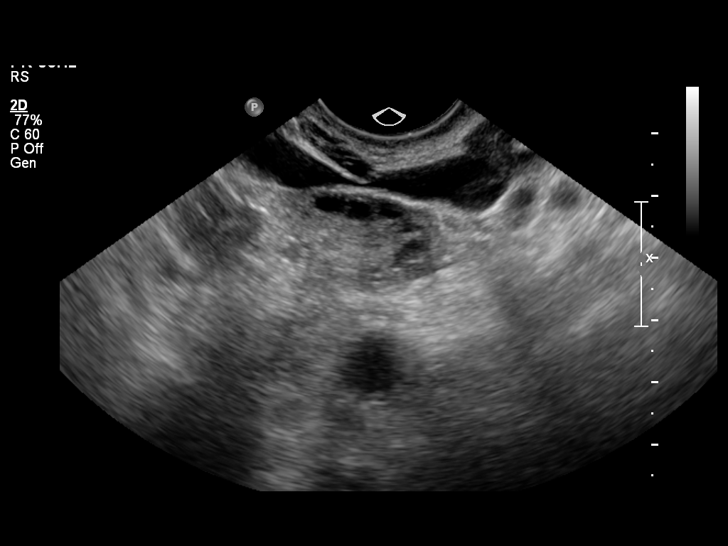
[im 35/47]
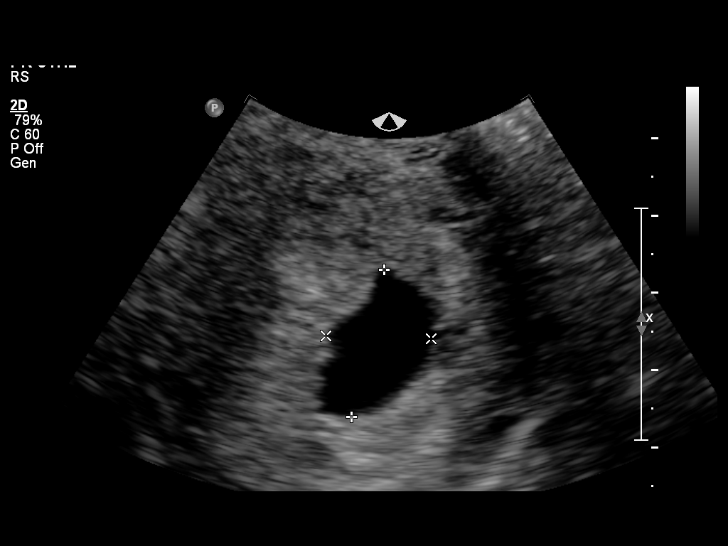
[im 38/47]
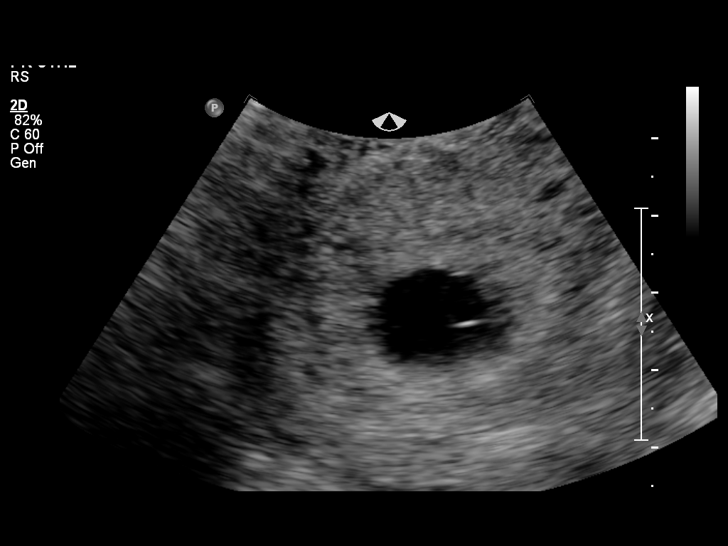
[im 41/47]
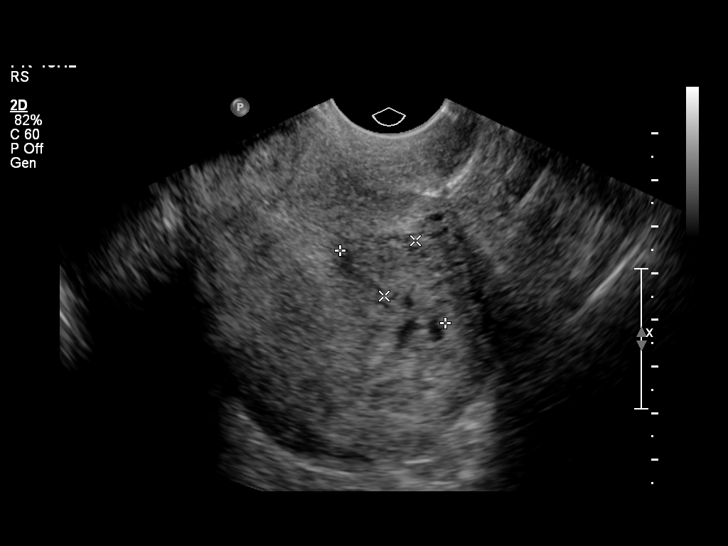
[im 45/47]
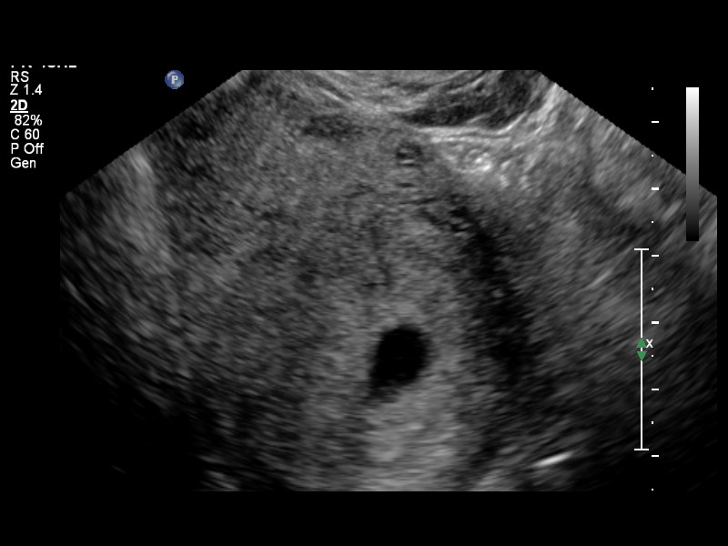

[13 of 28 positions shown; findings below may reference images not displayed]

OBSTETRICS REPORT
                    (Corrected Final 11/11/2012 [DATE])

Service(s) Provided

 US OB TRANSVAGINAL                                    76817.0
Indications

 Vaginal bleeding, unknown etiology
 Pregnancy with inconclusive fetal viability
Fetal Evaluation

 Num Of Fetuses:    1
 Preg. Location:    Intrauterine
 Gest. Sac:         Irregular, moderate
                    subchorionic bleed
 Yolk Sac:          Visualized, measures
                    7mm
 Fetal Pole:        Not visualized
 Cardiac Activity:  No embryo visualized
Biometry

 GS:        18  mm     G. Age:  6w 6d                  EDD:    06/28/13
Cervix Uterus Adnexa

 Cervix:       Normal appearance by transvaginal scan
 Uterus:       Normal shape and size.
 Left Ovary:    Within normal limits measuring 1.9 x 2.8 x 1.6 cm.
 Right Ovary:   Within normal limits measuring 3.0 x 1.7 x 2.8 cm.

 Adnexa:     No abnormality visualized.
Impression

 Irregular gestational sac with macro yolk sac and no change
 in appearance or gestational sac growth since prior exam.
 Findings are strongly suspicious but not confirmatory for
 failed pregnancy and would require reevaluation in 4 days (>
 10 days from visualization of gestational sac and yolk sac) for
 absolute confirmation.

 Normal ovaries.
 questions or concerns.
                 Attending Physician, MINO

## 2014-04-16 ENCOUNTER — Encounter (HOSPITAL_COMMUNITY): Payer: Self-pay | Admitting: Pharmacist

## 2014-04-27 NOTE — Patient Instructions (Addendum)
   Your procedure is scheduled on: April 30 2014  Enter through the Main Entrance of [facility] at: 8am Pick up the phone at the desk and dial [phone] and inform us of your arrival.  Please call this number if you have any problems the morning of surgery: [phone]  Remember: Do not eat food after midnight: Do not drink clear liquids after: Take these medicines the morning of surgery with a SIP OF WATER:  Do not wear jewelry, make-up, or FINGER nail polish No metal in your hair or on your body. Do not wear lotions, powders, perfumes.  You may wear deodorant.  Do not bring valuables to the hospital. Contacts, dentures or bridgework may not be worn into surgery.  Leave suitcase in the car. After Surgery it may be brought to your room. For patients being admitted to the hospital, checkout time is 11:00am the day of discharge.    Patients discharged on the day of surgery will not be allowed to drive home.

## 2014-04-28 ENCOUNTER — Encounter (HOSPITAL_COMMUNITY): Payer: Self-pay

## 2014-04-28 ENCOUNTER — Encounter (HOSPITAL_COMMUNITY)
Admission: RE | Admit: 2014-04-28 | Discharge: 2014-04-28 | Disposition: A | Discharge status: Home / Self Care | Payer: Medicaid Other | Source: Ambulatory Visit | Attending: Obstetrics & Gynecology | Admitting: Obstetrics & Gynecology

## 2014-04-28 DIAGNOSIS — Z01818 Encounter for other preprocedural examination: Secondary | ICD-10-CM | POA: Insufficient documentation

## 2014-04-28 DIAGNOSIS — Z01812 Encounter for preprocedural laboratory examination: Secondary | ICD-10-CM | POA: Insufficient documentation

## 2014-04-28 LAB — OB RESULTS CONSOLE ABO/RH

## 2014-04-28 LAB — CBC
HCT: 34.1 % — ABNORMAL LOW (ref 36.0–46.0)
HEMOGLOBIN: 11 g/dL — AB (ref 12.0–15.0)
MCH: 27.4 pg (ref 26.0–34.0)
MCHC: 32.3 g/dL (ref 30.0–36.0)
MCV: 85 fL (ref 78.0–100.0)
PLATELETS: 184 10*3/uL (ref 150–400)
RBC: 4.01 MIL/uL (ref 3.87–5.11)
RDW: 14.1 % (ref 11.5–15.5)
WBC: 10.9 10*3/uL — ABNORMAL HIGH (ref 4.0–10.5)

## 2014-04-28 LAB — RPR

## 2014-04-30 ENCOUNTER — Encounter (HOSPITAL_COMMUNITY): Payer: Self-pay | Admitting: Obstetrics & Gynecology

## 2014-04-30 ENCOUNTER — Inpatient Hospital Stay (HOSPITAL_COMMUNITY)
Admission: RE | Admit: 2014-04-30 | Discharge: 2014-05-02 | DRG: 766 | Disposition: A | Discharge status: Home / Self Care | Payer: Medicaid Other | Source: Ambulatory Visit | Attending: Obstetrics & Gynecology | Admitting: Obstetrics & Gynecology

## 2014-04-30 ENCOUNTER — Inpatient Hospital Stay (HOSPITAL_COMMUNITY): Payer: Medicaid Other | Admitting: Anesthesiology

## 2014-04-30 ENCOUNTER — Encounter (HOSPITAL_COMMUNITY)
Admission: RE | Disposition: A | Discharge status: Home / Self Care | Payer: Self-pay | Source: Ambulatory Visit | Attending: Obstetrics & Gynecology

## 2014-04-30 ENCOUNTER — Encounter (HOSPITAL_COMMUNITY): Payer: Medicaid Other | Admitting: Anesthesiology

## 2014-04-30 DIAGNOSIS — N736 Female pelvic peritoneal adhesions (postinfective): Secondary | ICD-10-CM

## 2014-04-30 DIAGNOSIS — O34219 Maternal care for unspecified type scar from previous cesarean delivery: Principal | ICD-10-CM | POA: Diagnosis present

## 2014-04-30 DIAGNOSIS — O99892 Other specified diseases and conditions complicating childbirth: Secondary | ICD-10-CM | POA: Diagnosis present

## 2014-04-30 DIAGNOSIS — O9989 Other specified diseases and conditions complicating pregnancy, childbirth and the puerperium: Secondary | ICD-10-CM

## 2014-04-30 DIAGNOSIS — Z98891 History of uterine scar from previous surgery: Secondary | ICD-10-CM

## 2014-04-30 DIAGNOSIS — Z302 Encounter for sterilization: Secondary | ICD-10-CM

## 2014-04-30 DIAGNOSIS — Z22330 Carrier of Group B streptococcus: Secondary | ICD-10-CM

## 2014-04-30 HISTORY — PX: LYSIS OF ADHESION: SHX5961

## 2014-04-30 LAB — PREPARE RBC (CROSSMATCH)

## 2014-04-30 SURGERY — Surgical Case
Anesthesia: Spinal | Site: Abdomen

## 2014-04-30 MED ORDER — LIDOCAINE-EPINEPHRINE 2 %-1:100000 IJ SOLN
INTRAMUSCULAR | Status: DC | PRN
  Administered 2014-04-30 (×3): 5 mL via INTRADERMAL

## 2014-04-30 MED ORDER — DIBUCAINE 1 % RE OINT: 1.0000 "application " | TOPICAL_OINTMENT | RECTAL | Status: DC | PRN

## 2014-04-30 MED ORDER — LACTATED RINGERS IV SOLN
Freq: Once | INTRAVENOUS | Status: AC
  Administered 2014-04-30: 09:00:00 via INTRAVENOUS

## 2014-04-30 MED ORDER — PROMETHAZINE HCL 25 MG/ML IJ SOLN: 6.2500 mg | INTRAMUSCULAR | Status: DC | PRN

## 2014-04-30 MED ORDER — LACTATED RINGERS IV SOLN
40.0000 [IU] | INTRAVENOUS | Status: DC | PRN
  Administered 2014-04-30: 40 [IU] via INTRAVENOUS

## 2014-04-30 MED ORDER — FENTANYL CITRATE 0.05 MG/ML IJ SOLN: 25.0000 ug | INTRAMUSCULAR | Status: DC | PRN

## 2014-04-30 MED ORDER — ZOLPIDEM TARTRATE 5 MG PO TABS: 5.0000 mg | ORAL_TABLET | Freq: Every evening | ORAL | Status: DC | PRN

## 2014-04-30 MED ORDER — ONDANSETRON HCL 4 MG/2ML IJ SOLN
4.0000 mg | INTRAMUSCULAR | Status: DC | PRN
  Administered 2014-04-30: 4 mg via INTRAVENOUS
  Filled 2014-04-30: qty 2

## 2014-04-30 MED ORDER — ONDANSETRON HCL 4 MG/2ML IJ SOLN
INTRAMUSCULAR | Status: AC
  Filled 2014-04-30: qty 2

## 2014-04-30 MED ORDER — NALOXONE HCL 1 MG/ML IJ SOLN: 1.0000 ug/kg/h | INTRAVENOUS | Status: DC | PRN

## 2014-04-30 MED ORDER — MORPHINE SULFATE (PF) 0.5 MG/ML IJ SOLN
INTRAMUSCULAR | Status: DC | PRN
  Administered 2014-04-30: 2.5 mg via EPIDURAL
  Administered 2014-04-30: .15 mg via INTRATHECAL

## 2014-04-30 MED ORDER — DIPHENHYDRAMINE HCL 50 MG/ML IJ SOLN: 12.5000 mg | INTRAMUSCULAR | Status: DC | PRN

## 2014-04-30 MED ORDER — MAGNESIUM HYDROXIDE 400 MG/5ML PO SUSP: 30.0000 mL | ORAL | Status: DC | PRN

## 2014-04-30 MED ORDER — SCOPOLAMINE 1 MG/3DAYS TD PT72
MEDICATED_PATCH | TRANSDERMAL | Status: AC
  Administered 2014-04-30: 1.5 mg via TRANSDERMAL
  Filled 2014-04-30: qty 1

## 2014-04-30 MED ORDER — KETOROLAC TROMETHAMINE 30 MG/ML IJ SOLN
INTRAMUSCULAR | Status: AC
  Administered 2014-04-30: 30 mg via INTRAMUSCULAR
  Filled 2014-04-30: qty 1

## 2014-04-30 MED ORDER — OXYCODONE-ACETAMINOPHEN 5-325 MG PO TABS
1.0000 | ORAL_TABLET | ORAL | Status: DC | PRN
  Administered 2014-05-01 – 2014-05-02 (×4): 1 via ORAL
  Filled 2014-04-30 (×4): qty 1

## 2014-04-30 MED ORDER — SCOPOLAMINE 1 MG/3DAYS TD PT72: 1.0000 | MEDICATED_PATCH | Freq: Once | TRANSDERMAL | Status: DC

## 2014-04-30 MED ORDER — MORPHINE SULFATE 0.5 MG/ML IJ SOLN
INTRAMUSCULAR | Status: AC
  Filled 2014-04-30: qty 10

## 2014-04-30 MED ORDER — ACETAMINOPHEN 500 MG PO TABS: 1000.0000 mg | ORAL_TABLET | Freq: Four times a day (QID) | ORAL | Status: AC

## 2014-04-30 MED ORDER — DIPHENHYDRAMINE HCL 50 MG/ML IJ SOLN
INTRAMUSCULAR | Status: DC | PRN
  Administered 2014-04-30: 25 mg via INTRAVENOUS

## 2014-04-30 MED ORDER — LANOLIN HYDROUS EX OINT: 1.0000 "application " | TOPICAL_OINTMENT | CUTANEOUS | Status: DC | PRN

## 2014-04-30 MED ORDER — FENTANYL CITRATE 0.05 MG/ML IJ SOLN
INTRAMUSCULAR | Status: AC
  Filled 2014-04-30: qty 2

## 2014-04-30 MED ORDER — MIDAZOLAM HCL 2 MG/2ML IJ SOLN: 0.5000 mg | Freq: Once | INTRAMUSCULAR | Status: DC | PRN

## 2014-04-30 MED ORDER — ONDANSETRON HCL 4 MG PO TABS: 4.0000 mg | ORAL_TABLET | ORAL | Status: DC | PRN

## 2014-04-30 MED ORDER — PHENYLEPHRINE 8 MG IN D5W 100 ML (0.08MG/ML) PREMIX OPTIME
INJECTION | INTRAVENOUS | Status: AC
  Filled 2014-04-30: qty 100

## 2014-04-30 MED ORDER — BUPIVACAINE HCL (PF) 0.5 % IJ SOLN
INTRAMUSCULAR | Status: AC
  Filled 2014-04-30: qty 30

## 2014-04-30 MED ORDER — MENTHOL 3 MG MT LOZG: 1.0000 | LOZENGE | OROMUCOSAL | Status: DC | PRN

## 2014-04-30 MED ORDER — SIMETHICONE 80 MG PO CHEW: 80.0000 mg | CHEWABLE_TABLET | ORAL | Status: DC | PRN

## 2014-04-30 MED ORDER — PHENYLEPHRINE 8 MG IN D5W 100 ML (0.08MG/ML) PREMIX OPTIME
INJECTION | INTRAVENOUS | Status: DC | PRN
  Administered 2014-04-30: 60 ug/min via INTRAVENOUS

## 2014-04-30 MED ORDER — KETOROLAC TROMETHAMINE 30 MG/ML IJ SOLN
30.0000 mg | Freq: Four times a day (QID) | INTRAMUSCULAR | Status: AC | PRN
  Administered 2014-04-30: 30 mg via INTRAMUSCULAR

## 2014-04-30 MED ORDER — ACETAMINOPHEN 500 MG PO TABS: 1000.0000 mg | ORAL_TABLET | Freq: Four times a day (QID) | ORAL | Status: DC

## 2014-04-30 MED ORDER — DIPHENHYDRAMINE HCL 25 MG PO CAPS: 25.0000 mg | ORAL_CAPSULE | Freq: Four times a day (QID) | ORAL | Status: DC | PRN

## 2014-04-30 MED ORDER — MEASLES, MUMPS & RUBELLA VAC ~~LOC~~ INJ
0.5000 mL | INJECTION | Freq: Once | SUBCUTANEOUS | Status: DC
Start: 2014-05-01 — End: 2014-04-30
  Filled 2014-04-30: qty 0.5

## 2014-04-30 MED ORDER — IBUPROFEN 600 MG PO TABS
600.0000 mg | ORAL_TABLET | Freq: Four times a day (QID) | ORAL | Status: DC
Start: 2014-04-30 — End: 2014-05-02
  Administered 2014-05-01 – 2014-05-02 (×5): 600 mg via ORAL
  Filled 2014-04-30 (×6): qty 1

## 2014-04-30 MED ORDER — LACTATED RINGERS IV SOLN
INTRAVENOUS | Status: DC | PRN
  Administered 2014-04-30 (×3): via INTRAVENOUS

## 2014-04-30 MED ORDER — DEXTROSE 5 % IV SOLN
2.0000 g | Freq: Once | INTRAVENOUS | Status: AC
  Administered 2014-04-30: 2 g via INTRAVENOUS
  Filled 2014-04-30: qty 2

## 2014-04-30 MED ORDER — PRENATAL MULTIVITAMIN CH
1.0000 | ORAL_TABLET | Freq: Every day | ORAL | Status: DC
  Administered 2014-05-01 – 2014-05-02 (×2): 1 via ORAL
  Filled 2014-04-30 (×2): qty 1

## 2014-04-30 MED ORDER — SODIUM CHLORIDE 0.9 % IJ SOLN: 3.0000 mL | INTRAMUSCULAR | Status: DC | PRN

## 2014-04-30 MED ORDER — WITCH HAZEL-GLYCERIN EX PADS: 1.0000 "application " | MEDICATED_PAD | CUTANEOUS | Status: DC | PRN

## 2014-04-30 MED ORDER — SCOPOLAMINE 1 MG/3DAYS TD PT72
1.0000 | MEDICATED_PATCH | Freq: Once | TRANSDERMAL | Status: DC
  Administered 2014-04-30: 1.5 mg via TRANSDERMAL

## 2014-04-30 MED ORDER — SENNOSIDES-DOCUSATE SODIUM 8.6-50 MG PO TABS
2.0000 | ORAL_TABLET | ORAL | Status: DC
  Administered 2014-05-01: 2 via ORAL
  Filled 2014-04-30 (×2): qty 2

## 2014-04-30 MED ORDER — MEPERIDINE HCL 25 MG/ML IJ SOLN
6.2500 mg | INTRAMUSCULAR | Status: DC | PRN
Start: 2014-04-30 — End: 2014-04-30
  Administered 2014-04-30: 12.5 mg via INTRAVENOUS

## 2014-04-30 MED ORDER — BUPIVACAINE HCL (PF) 0.5 % IJ SOLN
INTRAMUSCULAR | Status: DC | PRN
  Administered 2014-04-30: 30 mL

## 2014-04-30 MED ORDER — PHENYLEPHRINE HCL 10 MG/ML IJ SOLN
INTRAMUSCULAR | Status: DC | PRN
  Administered 2014-04-30 (×2): 80 ug via INTRAVENOUS

## 2014-04-30 MED ORDER — NALOXONE HCL 0.4 MG/ML IJ SOLN: 0.4000 mg | INTRAMUSCULAR | Status: DC | PRN

## 2014-04-30 MED ORDER — METOCLOPRAMIDE HCL 5 MG/ML IJ SOLN
INTRAMUSCULAR | Status: AC
  Administered 2014-04-30: 10 mg via INTRAVENOUS
  Filled 2014-04-30: qty 2

## 2014-04-30 MED ORDER — PHENYLEPHRINE 40 MCG/ML (10ML) SYRINGE FOR IV PUSH (FOR BLOOD PRESSURE SUPPORT)
PREFILLED_SYRINGE | INTRAVENOUS | Status: AC
  Filled 2014-04-30: qty 5

## 2014-04-30 MED ORDER — OXYTOCIN 10 UNIT/ML IJ SOLN
INTRAMUSCULAR | Status: AC
  Filled 2014-04-30: qty 4

## 2014-04-30 MED ORDER — DIPHENHYDRAMINE HCL 50 MG/ML IJ SOLN: 25.0000 mg | INTRAMUSCULAR | Status: DC | PRN

## 2014-04-30 MED ORDER — IBUPROFEN 600 MG PO TABS: 600.0000 mg | ORAL_TABLET | Freq: Four times a day (QID) | ORAL | Status: DC | PRN

## 2014-04-30 MED ORDER — OXYTOCIN 40 UNITS IN LACTATED RINGERS INFUSION - SIMPLE MED: 62.5000 mL/h | INTRAVENOUS | Status: AC

## 2014-04-30 MED ORDER — KETOROLAC TROMETHAMINE 30 MG/ML IJ SOLN
30.0000 mg | Freq: Four times a day (QID) | INTRAMUSCULAR | Status: AC | PRN
  Administered 2014-05-01 (×2): 30 mg via INTRAVENOUS
  Filled 2014-04-30 (×2): qty 1

## 2014-04-30 MED ORDER — DIPHENHYDRAMINE HCL 50 MG/ML IJ SOLN
INTRAMUSCULAR | Status: AC
  Filled 2014-04-30: qty 1

## 2014-04-30 MED ORDER — NALBUPHINE HCL 10 MG/ML IJ SOLN
5.0000 mg | INTRAMUSCULAR | Status: DC | PRN
  Administered 2014-04-30: 10 mg via INTRAVENOUS
  Filled 2014-04-30: qty 1

## 2014-04-30 MED ORDER — ONDANSETRON HCL 4 MG/2ML IJ SOLN
INTRAMUSCULAR | Status: DC | PRN
  Administered 2014-04-30: 4 mg via INTRAVENOUS

## 2014-04-30 MED ORDER — LACTATED RINGERS IV BOLUS (SEPSIS)
500.0000 mL | Freq: Once | INTRAVENOUS | Status: AC
  Administered 2014-04-30: 500 mL via INTRAVENOUS

## 2014-04-30 MED ORDER — FENTANYL CITRATE 0.05 MG/ML IJ SOLN
INTRAMUSCULAR | Status: DC | PRN
Start: 2014-04-30 — End: 2014-04-30
  Administered 2014-04-30 (×3): 25 ug via INTRAVENOUS
  Administered 2014-04-30: 25 ug via INTRATHECAL

## 2014-04-30 MED ORDER — SODIUM CHLORIDE 0.9 % IR SOLN
Status: DC | PRN
  Administered 2014-04-30: 1000 mL

## 2014-04-30 MED ORDER — BUPIVACAINE IN DEXTROSE 0.75-8.25 % IT SOLN
INTRATHECAL | Status: DC | PRN
  Administered 2014-04-30: 1.6 mL via INTRATHECAL

## 2014-04-30 MED ORDER — METOCLOPRAMIDE HCL 5 MG/ML IJ SOLN
10.0000 mg | Freq: Three times a day (TID) | INTRAMUSCULAR | Status: DC | PRN
  Administered 2014-04-30 – 2014-05-01 (×2): 10 mg via INTRAVENOUS
  Filled 2014-04-30: qty 2

## 2014-04-30 MED ORDER — NALBUPHINE HCL 10 MG/ML IJ SOLN
INTRAMUSCULAR | Status: AC
  Administered 2014-04-30: 5 mg via SUBCUTANEOUS
  Filled 2014-04-30: qty 1

## 2014-04-30 MED ORDER — SCOPOLAMINE 1 MG/3DAYS TD PT72
MEDICATED_PATCH | TRANSDERMAL | Status: AC
  Filled 2014-04-30: qty 1

## 2014-04-30 MED ORDER — DIPHENHYDRAMINE HCL 25 MG PO CAPS: 25.0000 mg | ORAL_CAPSULE | ORAL | Status: DC | PRN

## 2014-04-30 MED ORDER — NALBUPHINE HCL 10 MG/ML IJ SOLN
5.0000 mg | INTRAMUSCULAR | Status: DC | PRN
  Administered 2014-04-30: 5 mg via SUBCUTANEOUS

## 2014-04-30 MED ORDER — ONDANSETRON HCL 4 MG/2ML IJ SOLN: 4.0000 mg | Freq: Three times a day (TID) | INTRAMUSCULAR | Status: DC | PRN

## 2014-04-30 MED ORDER — MEPERIDINE HCL 25 MG/ML IJ SOLN
INTRAMUSCULAR | Status: AC
  Filled 2014-04-30: qty 1

## 2014-04-30 MED ORDER — SIMETHICONE 80 MG PO CHEW
80.0000 mg | CHEWABLE_TABLET | ORAL | Status: DC
  Administered 2014-05-01: 80 mg via ORAL
  Filled 2014-04-30 (×2): qty 1

## 2014-04-30 MED ORDER — MEPERIDINE HCL 25 MG/ML IJ SOLN: 6.2500 mg | INTRAMUSCULAR | Status: DC | PRN

## 2014-04-30 SURGICAL SUPPLY — 42 items
CLAMP CORD UMBIL (MISCELLANEOUS) IMPLANT
CLIP FILSHIE TUBAL LIGA STRL (Clip) ×4 IMPLANT
CLOTH BEACON ORANGE TIMEOUT ST (SAFETY) ×4 IMPLANT
DRAPE LG THREE QUARTER DISP (DRAPES) IMPLANT
DRSG OPSITE POSTOP 4X10 (GAUZE/BANDAGES/DRESSINGS) ×4 IMPLANT
DURAPREP 26ML APPLICATOR (WOUND CARE) ×4 IMPLANT
ELECT REM PT RETURN 9FT ADLT (ELECTROSURGICAL) ×4
ELECTRODE REM PT RTRN 9FT ADLT (ELECTROSURGICAL) ×2 IMPLANT
EXTRACTOR VACUUM M CUP 4 TUBE (SUCTIONS) ×3 IMPLANT
EXTRACTOR VACUUM M CUP 4' TUBE (SUCTIONS) ×1
GAUZE SPONGE 4X4 12PLY STRL LF (GAUZE/BANDAGES/DRESSINGS) ×4 IMPLANT
GLOVE BIOGEL PI IND STRL 7.0 (GLOVE) ×2 IMPLANT
GLOVE BIOGEL PI INDICATOR 7.0 (GLOVE) ×2
GLOVE ECLIPSE 7.0 STRL STRAW (GLOVE) ×4 IMPLANT
GOWN STRL NON-REIN LRG LVL3 (GOWN DISPOSABLE) ×4 IMPLANT
GOWN STRL REUS W/TWL LRG LVL3 (GOWN DISPOSABLE) ×8 IMPLANT
HEMOSTAT SURGICEL 4X8 (HEMOSTASIS) ×4 IMPLANT
KIT ABG SYR 3ML LUER SLIP (SYRINGE) IMPLANT
NEEDLE HYPO 22GX1.5 SAFETY (NEEDLE) ×4 IMPLANT
NEEDLE HYPO 25X5/8 SAFETYGLIDE (NEEDLE) ×4 IMPLANT
NS IRRIG 1000ML POUR BTL (IV SOLUTION) ×4 IMPLANT
PACK C SECTION WH (CUSTOM PROCEDURE TRAY) ×4 IMPLANT
PAD ABD 7.5X8 STRL (GAUZE/BANDAGES/DRESSINGS) ×4 IMPLANT
PAD ABD 8X10 STRL (GAUZE/BANDAGES/DRESSINGS) ×4 IMPLANT
PAD OB MATERNITY 4.3X12.25 (PERSONAL CARE ITEMS) ×4 IMPLANT
RETRACTOR WND ALEXIS 25 LRG (MISCELLANEOUS) ×2 IMPLANT
RTRCTR C-SECT PINK 25CM LRG (MISCELLANEOUS) IMPLANT
RTRCTR WOUND ALEXIS 25CM LRG (MISCELLANEOUS) ×4
STAPLER VISISTAT 35W (STAPLE) IMPLANT
SUT PDS AB 0 CTX 36 PDP370T (SUTURE) IMPLANT
SUT PDS AB 0 CTX 60 (SUTURE) ×4 IMPLANT
SUT PLAIN 2 0 XLH (SUTURE) ×4 IMPLANT
SUT VIC AB 0 CT1 36 (SUTURE) ×8 IMPLANT
SUT VIC AB 0 CTX 36 (SUTURE) ×4
SUT VIC AB 0 CTX36XBRD ANBCTRL (SUTURE) ×4 IMPLANT
SUT VIC AB 2-0 CT1 (SUTURE) ×4 IMPLANT
SUT VIC AB 4-0 KS 27 (SUTURE) ×4 IMPLANT
SYR CONTROL 10ML LL (SYRINGE) ×4 IMPLANT
TAPE CLOTH SURG 4X10 WHT LF (GAUZE/BANDAGES/DRESSINGS) ×4 IMPLANT
TOWEL OR 17X24 6PK STRL BLUE (TOWEL DISPOSABLE) ×4 IMPLANT
TRAY FOLEY CATH 14FR (SET/KITS/TRAYS/PACK) ×4 IMPLANT
WATER STERILE IRR 1000ML POUR (IV SOLUTION) ×4 IMPLANT

## 2014-04-30 NOTE — Anesthesia Postprocedure Evaluation (Signed)
  Anesthesia Post-op Note  Patient: Rowe RobertGloria Mendoza-DeLeon  Procedure(s) Performed: Procedure(s): REPEAT CESAREAN SECTION WITH BILATERAL TUBAL LIGATION (Bilateral) LYSIS OF ADHESION (N/A)  Patient Location: PACU and Mother/Baby  Anesthesia Type:Epidural  Level of Consciousness: awake, alert  and oriented  Airway and Oxygen Therapy: Patient Spontanous Breathing  Post-op Pain: mild  Post-op Assessment: Patient's Cardiovascular Status Stable, Respiratory Function Stable, NAUSEA AND VOMITING PRESENT and Pain level controlled Patient with nausea, just vomited. Urine concentrated and unable to drink liquid without throwing up. 500ml fluid bolus ordered.  Post-op Vital Signs: Reviewed and stable  Last Vitals:  Filed Vitals:   04/30/14 1643  BP: 102/60  Pulse: 64  Temp: 36.8 C  Resp: 16    Complications: No apparent anesthesia complications

## 2014-04-30 NOTE — OR Nursing (Signed)
Communicated to [place] rn to not dc epidural cath until ordered. Margarita Mailoni Zephaniah Lubrano rn

## 2014-04-30 NOTE — Anesthesia Procedure Notes (Addendum)
Spinal  Patient location during procedure: OR Start time: 04/30/2014 9:36 AM Staffing Anesthesiologist: Brayton CavesJACKSON, Kymberlie Brazeau Performed by: anesthesiologist  Preanesthetic Checklist Completed: patient identified, site marked, surgical consent, pre-op evaluation, timeout performed, IV checked, risks and benefits discussed and monitors and equipment checked Spinal Block Patient position: sitting Prep: DuraPrep Patient monitoring: heart rate, cardiac monitor, continuous pulse ox and blood pressure Approach: midline Location: L3-4 Injection technique: single-shot Needle Needle type: Sprotte  Needle gauge: 24 G Needle length: 9 cm Assessment Sensory level: T4 Additional Notes Patient identified.  Risk benefits discussed including failed block, incomplete pain control, headache, nerve damage, paralysis, blood pressure changes, nausea, vomiting, reactions to medication both toxic or allergic, and postpartum back pain.  Patient expressed understanding and wished to proceed.  All questions were answered.  Sterile technique used throughout procedure.  CSF was clear.  No parasthesia or other complications.  Please see nursing notes for vital signs.   Epidural Patient location during procedure: OR Start time: 04/30/2014 9:47 AM  Staffing Anesthesiologist: Brayton CavesJACKSON, Margarite Vessel Performed by: anesthesiologist   Preanesthetic Checklist Completed: patient identified, site marked, surgical consent, pre-op evaluation, timeout performed, IV checked, risks and benefits discussed and monitors and equipment checked  Epidural Patient position: sitting Prep: site prepped and draped and DuraPrep Patient monitoring: continuous pulse ox and blood pressure Approach: midline Location: L3-L4 Injection technique: LOR air  Needle:  Needle type: Tuohy  Needle gauge: 17 G Needle length: 9 cm and 9 Needle insertion depth: 5 cm cm Catheter type: closed end flexible Catheter size: 19 Gauge Catheter at skin depth:  10 cm Test dose: negative  Assessment Events: blood not aspirated, injection not painful, no injection resistance, negative IV test and no paresthesia  Additional Notes Patient identified.  Risk benefits discussed including failed block, incomplete pain control, headache, nerve damage, paralysis, blood pressure changes, nausea, vomiting, reactions to medication both toxic or allergic, and postpartum back pain.  Patient expressed understanding and wished to proceed.  All questions were answered.  Sterile technique used throughout procedure and epidural site dressed with sterile barrier dressing. No paresthesia or other complications noted.The patient did not experience any signs of intravascular injection such as tinnitus or metallic taste in mouth nor signs of intrathecal spread such as rapid motor block. Please see nursing notes for vital signs.

## 2014-04-30 NOTE — Addendum Note (Signed)
Addendum created 04/30/14 1731 by Elbert Ewingsolleen S Tangelia Sanson, CRNA   Modules edited: Notes Section, Orders   Notes Section:  File: [id number]

## 2014-04-30 NOTE — Transfer of Care (Signed)
Immediate Anesthesia Transfer of Care Note  Patient: Shelley Chen  Procedure(s) Performed: Procedure(s): REPEAT CESAREAN SECTION WITH BILATERAL TUBAL LIGATION (Bilateral) LYSIS OF ADHESION (N/A)  Patient Location: PACU  Anesthesia Type:Spinal and Epidural  Level of Consciousness: awake, alert  and oriented  Airway & Oxygen Therapy: Patient Spontanous Breathing and Patient connected to nasal cannula oxygen  Post-op Assessment: Report given to PACU RN  Post vital signs: Reviewed and stable  Complications: No apparent anesthesia complications

## 2014-04-30 NOTE — Anesthesia Postprocedure Evaluation (Signed)
  Anesthesia Post Note  Patient: Shelley Chen  Procedure(s) Performed: Procedure(s) (LRB): REPEAT CESAREAN SECTION WITH BILATERAL TUBAL LIGATION (Bilateral) LYSIS OF ADHESION (N/A)  Anesthesia type: Spinal  Patient location: PACU  Post pain: Pain level controlled  Post assessment: Post-op Vital signs reviewed  Last Vitals:  Filed Vitals:   04/30/14 1215  BP:   Pulse: 66  Temp:   Resp: 16    Post vital signs: Reviewed  Level of consciousness: awake  Complications: No apparent anesthesia complications

## 2014-04-30 NOTE — Anesthesia Preprocedure Evaluation (Signed)
Anesthesia Evaluation  Patient identified by MRN, date of birth, ID band Patient awake    Reviewed: Allergy & Precautions, H&P , NPO status , Patient's Chart, lab work & pertinent test results  Airway Mallampati: II       Dental   Pulmonary  breath sounds clear to auscultation        Cardiovascular Exercise Tolerance: Good Rhythm:regular Rate:Normal     Neuro/Psych    GI/Hepatic   Endo/Other    Renal/GU      Musculoskeletal   Abdominal   Peds  Hematology   Anesthesia Other Findings   Reproductive/Obstetrics (+) Pregnancy                             Anesthesia Physical Anesthesia Plan  ASA: II  Anesthesia Plan: Spinal   Post-op Pain Management:    Induction:   Airway Management Planned:   Additional Equipment:   Intra-op Plan:   Post-operative Plan:   Informed Consent: I have reviewed the patients History and Physical, chart, labs and discussed the procedure including the risks, benefits and alternatives for the proposed anesthesia with the patient or authorized representative who has indicated his/her understanding and acceptance.     Plan Discussed with: Anesthesiologist, CRNA and Surgeon  Anesthesia Plan Comments:         Anesthesia Quick Evaluation  

## 2014-04-30 NOTE — H&P (Addendum)
Obstetric Preoperative History and Physical  Shelley Chen is a 23 y.o. [phone] with IUP at 3998w0d presenting for presenting for scheduled repeat cesarean section and BTS.  No acute concerns.   Prenatal Course Source of Care: [facility]  Pregnancy complications or risks: -Previous cesarean section x 2 -Previous ectopic pregnancy treated medically -Extensive adhesive disease noted during 2nd cesarean section, vertical incision  Recommended -Failed 1 hr GTT, normal 3 hr GTT She plans to breastfeed She desires bilateral tubal ligation for postpartum contraception.   Prenatal labs and studies: ABO, Rh: --/--/O POS (07/21 0930) Antibody: NEG (07/21 0930) Rubella: Immune (01/21 0000) RPR: NON REAC (07/21 0930)  HBsAg:  Negative HIV: Non-reactive (01/21 0000)  XBJ:YNWGNFAOGBS:Positive (01/21 0000) 1 hr Glucola  Abnormal, normal 3 hour GTT Genetic screening normal Anatomy US normal  Prenatal Transfer Tool  Maternal Diabetes: No Genetic Screening: Normal Maternal Ultrasounds/Referrals: Normal Fetal Ultrasounds or other Referrals:  None Maternal Substance Abuse:  No Significant Maternal Medications:  None Significant Maternal Lab Results: None  Past Medical History  Diagnosis Date  . No pertinent past medical history     Past Surgical History  Procedure Laterality Date  . Cesarean section      C/S x 2  . Dilation and evacuation  11/12/2012    Procedure: DILATATION AND EVACUATION;  Surgeon: Reva Boresanya S Pratt, MD;  Location: [facility];  Service: Gynecology;  Laterality: N/A;    OB History  Gravida Para Term Preterm AB SAB TAB Ectopic Multiple Living  4 2 2       2     # Outcome Date GA Lbr Len/2nd Weight Sex Delivery Anes PTL Lv  4 CUR           3 TRM 11/01/09     CS     2 TRM 12/02/05     CS     1 GRA              Comments: System Generated. Please review and update pregnancy details.      History   Social History  . Marital Status: Single    Spouse Name: N/A    Number  of Children: N/A  . Years of Education: N/A   Social History Main Topics  . Smoking status: Never Smoker   . Smokeless tobacco: Not on file  . Alcohol Use: No  . Drug Use: No  . Sexual Activity: Yes    Birth Control/ Protection: None   Other Topics Concern  . Not on file   Social History Narrative  . No narrative on file    No family history on file.  Prescriptions prior to admission  Medication Sig Dispense Refill  . Multiple Vitamins-Minerals (MULTIVITAMIN) tablet Take 1 tablet by mouth daily.  30 tablet  5  . nitrofurantoin (MACRODANTIN) 100 MG capsule Take 100 mg by mouth 2 (two) times daily.        No Known Allergies  Review of Systems: Negative except for what is mentioned in HPI.  Physical Exam: BP 108/67  Pulse 93  Temp(Src) 98.4 F (36.9 C) (Oral)  Resp 18  SpO2 99%  LMP 06/05/2013 FHR by Doppler: 155 bpm GENERAL: Well-developed, well-nourished female in no acute distress.  LUNGS: Clear to auscultation bilaterally.  HEART: Regular rate and rhythm. ABDOMEN: Soft, nontender, nondistended, gravid, well-healed Pfannenstiel incision. PELVIC: Deferred EXTREMITIES: Nontender, no edema, 2+ distal pulses.   Pertinent Labs/Studies:   Results for orders placed during the hospital encounter of 04/30/14 (from the  past 72 hour(s))  PREPARE RBC (CROSSMATCH)     Status: None   Collection Time    04/30/14  9:53 AM      Result Value Ref Range   Order Confirmation ORDER PROCESSED BY BLOOD BANK      Assessment and Plan :Shelley Chen is a 23 y.o. [id number] at [redacted]w[redacted]d being admitted being admitted for scheduled repeat cesarean section and BTS. Given report of extensive adhesive disease during last surgery, will proceed via vertical incision. The risks of cesarean section were discussed with the patient including but were not limited to: bleeding which may require transfusion or reoperation; infection which may require antibiotics; injury to bowel, bladder, ureters or  other surrounding organs; injury to the fetus; need for additional procedures including reoperation or hysterectomy in the event of a life-threatening hemorrhage; placental abnormalities wth subsequent pregnancies, incisional problems, thromboembolic phenomenon and other postoperative/anesthesia complications.  Patient also desires permanent sterilization.  Risks of procedure discussed with patient including but not limited to: risk of regret, permanence of method, bleeding, infection, injury to surrounding organs and need for additional procedures.  Failure risk of 1-2% with increased risk of ectopic gestation if pregnancy occurs was also discussed with patient.  The patient concurred with the proposed plan, giving informed written consent for the procedures.  Patient has been NPO since last night she will remain NPO for procedure. Anesthesia and OR aware.  Preoperative prophylactic antibiotics and SCDs ordered on call to the OR.  To OR when ready.    Jaynie Collins, MD, FACOG  Attending Obstetrician & Gynecologist  Faculty Practice, [facility]

## 2014-04-30 NOTE — Op Note (Signed)
Terralyn Chen PROCEDURE DATE: 04/30/2014  PREOPERATIVE DIAGNOSES: Intrauterine pregnancy at [redacted] weeks gestation; previous cesarean section x 2; extensive adhesive disease noted during last cesarean section; undesired fertility  POSTOPERATIVE DIAGNOSES: The same  PROCEDURE: Repeat Low Transverse Cesarean Section, Lysis of Adhesions, Bilateral Tubal Sterilization using Filshie clips via infraumbilical vertical incision  SURGEON:  Dr. Jaynie CollinsUgonna Catelynn Sparger  ANESTHESIOLOGIST: Dr. Brayton CavesFreeman Jackson  INDICATIONS: Shelley Chen is a 23 y.o. [id number] at 39 weeks here for cesarean section and bilateral tubal sterilization secondary to the indications listed under preoperative diagnoses; please see preoperative note for further details.  She had extensive adhesive disease noted during last surgery; the plan was to proceed via vertical incision. The risks of surgery were discussed with the patient including but were not limited to: bleeding which may require transfusion or reoperation; infection which may require antibiotics; injury to bowel, bladder, ureters or other surrounding organs; injury to the fetus; need for additional procedures including reoperation or hysterectomy in the event of a life-threatening hemorrhage; placental abnormalities wth subsequent pregnancies, incisional problems, thromboembolic phenomenon and other postoperative/anesthesia complications.  Patient also desires permanent sterilization.  Other reversible forms of contraception were discussed with patient; she declines all other modalities. Risks of procedure discussed with patient including but not limited to: risk of regret, permanence of method, bleeding, infection, injury to surrounding organs and need for additional procedures.  Failure risk of 1-2% with increased risk of ectopic gestation if pregnancy occurs was also discussed with patient.  The patient concurred with the proposed plan, giving informed written consent for the  procedures.    FINDINGS:  Viable female infant in cephalic presentation.  Apgars 9 and 9, weight 8 lbs 10 oz.  Clear amniotic fluid.  Extensive thick adhesions between the entire lower uterine segment and lower fundal area, and the anterior abdominal wall.  These adhesions were lysed using electrocautery, sharp and blunt methods to obtain enough space to do the delivery through a high transverse incision. These lysed adhesions had significant bleeding after delivery, ameliorated with further electrocautery and suturing.  Surgicel was also placed around the area to help with hemostasis.  Intact placenta, three vessel cord.  Normal fallopian tubes and ovaries bilaterally. Fallopian tubes sterilized with Filshie clips bilaterally.  ANESTHESIA: Epidural INTRAVENOUS FLUIDS: 3400 ml ESTIMATED BLOOD LOSS: 1050 ml URINE OUTPUT:  500 ml SPECIMENS: Placenta sent to L&D COMPLICATIONS: None immediate  PROCEDURE IN DETAIL:  The patient preoperatively received intravenous antibiotics and had sequential compression devices applied to her lower extremities.   She was then taken to the operating room where epidural anesthesia was administered and was found to be adequate. She was then placed in a dorsal supine position with a leftward tilt, and prepped and draped in a sterile manner.  A foley catheter was placed into her bladder and attached to constant gravity.  After an adequate timeout was performed, an infraumbilical vertical skin incision was made with scalpel and carried through to the underlying layer of fascia. The fascia was incised in the midline, and this incision was extended superiorly and inferiorly using the Mayo scissors.   The rectus muscles were separated in the midline bluntly and the peritoneum was entered sharply.  Extensive thick adhesions were encountered, and there was not enough room to perform the delivery.  These adhesions were meticulously lysed with a combination of electrocautery, sharp and  blunt methods until enough room was created in the lower fundal area. A transverse hysterotomy was made with a scalpel and extended bilaterally  bluntly.  The infant was successfully delivered with vacuum assistance (vacuum was applied for less than three seconds), the cord was clamped and cut and the infant was handed over to awaiting neonatology team. Uterine massage was then administered, and the placenta delivered intact with a three-vessel cord. The uterus was then cleared of clot and debris.  The hysterotomy was closed with 0 Vicryl in a running locked fashion, and an imbricating layer was also placed with 0 Vicryl.  Figure-of-eight sutures were needed for hemostasis.  Attention was then turned to the fallopian tubes, and Filshie clips were placed about 3 cm from the cornua, with care given to incorporate the underlying mesosalpinx on both sides, allowing for bilateral tubal sterilization.  The pelvis was cleared of all clot and debris. The area of the lysed adhesions had significant bleeding noted, this was ameliorated with further electrocautery and suturing.  Surgicel was also placed around the area to help with hemostasis.  The peritoneum and fascia was then closed using looped 0 PDS in a running fashion.  The subcutaneous layer was irrigated, then reapproximated with 2-0 plain gut interrupted stitches, and 30 ml of 0.5% Marcaine was injected subcutaneously around the incision.  The skin was closed with staples. The patient tolerated the procedure well. Sponge, lap, instrument and needle counts were correct x 2.  She was taken to the recovery room in stable condition.     Jaynie Collins, MD, FACOG Attending Obstetrician & Gynecologist Faculty Practice, [facility]

## 2014-05-01 ENCOUNTER — Encounter (HOSPITAL_COMMUNITY): Payer: Self-pay | Admitting: *Deleted

## 2014-05-01 LAB — CBC
HCT: 26.4 % — ABNORMAL LOW (ref 36.0–46.0)
Hemoglobin: 8.3 g/dL — ABNORMAL LOW (ref 12.0–15.0)
MCH: 26.6 pg (ref 26.0–34.0)
MCHC: 31.4 g/dL (ref 30.0–36.0)
MCV: 84.6 fL (ref 78.0–100.0)
PLATELETS: 170 10*3/uL (ref 150–400)
RBC: 3.12 MIL/uL — ABNORMAL LOW (ref 3.87–5.11)
RDW: 14.2 % (ref 11.5–15.5)
WBC: 13.4 10*3/uL — AB (ref 4.0–10.5)

## 2014-05-01 LAB — BIRTH TISSUE RECOVERY COLLECTION (PLACENTA DONATION)

## 2014-05-01 NOTE — Progress Notes (Signed)
Epidural catheter removed per order.  Catheter intact upon removal.

## 2014-05-01 NOTE — Progress Notes (Addendum)
23 year old 744P2013 s/p repeat c-section at 6865w0d Subjective: Postpartum Day 1: Cesarean Delivery Patient reports tolerating PO, + flatus and no problems voiding.  She is ambulating. Bleeding is minimal. No other complaints.  Objective: Vital signs in last 24 hours: Temp:  [97.8 F (36.6 C)-99 F (37.2 C)] 98.1 F (36.7 C) (07/24 0500) Pulse Rate:  [64-98] 66 (07/24 0500) Resp:  [12-23] 18 (07/24 0500) BP: (89-110)/(45-77) 91/51 mmHg (07/24 0500) SpO2:  [94 %-100 %] 96 % (07/24 0500) Weight:  [78.926 kg (174 lb)] 78.926 kg (174 lb) (07/23 1321)  Physical Exam:  General: alert, cooperative and no distress Lochia: appropriate Uterine Fundus: firm Incision: healing well, no significant drainage, no significant erythema DVT Evaluation: No evidence of DVT seen on physical exam. Negative Homan's sign. No cords or calf tenderness.   Recent Labs  04/28/14 0930 05/01/14 0620  HGB 11.0* 8.3*  HCT 34.1* 26.4*    Assessment/Plan: Status post Cesarean section. Doing well postoperatively.  Hemoglobin dropped to 8.3. No symptoms of severe anemia. Recheck if symptomatic Continue current care.   Markus JarvisStephens, Darrel Baroni A 05/01/2014, 7:38 AM

## 2014-05-01 NOTE — Lactation Note (Signed)
This note was copied from the chart of Shelley Chen. Lactation Consultation Note  Patient Name: Shelley Chen ZOXWR'UToday's Date: 05/01/2014 Reason for consult: Follow-up assessment  RN asked LC to see pt d/t c/o cracked, SN and not wanting to BF d/t SN.  RN gave hand pump and comfort gels.  Patient can speak [place].  When LC entered room, mom had a flat affect and stated that the baby wants to breastfeed and then get formula.  LC asked if she wanted assisted with latching and she stated "no".  Asked if LC could help her with anything and she stated "no."  Day of Life supplementation guideline given and explained appropriate amount to give infant; explained belly size given day of life.  Chart review - infant 1535 hrs old and has breastfed x7 (10-30 min) + 1 (7 min) + formula x5 (20-40 ml); voids-5; stools-5 in past 24 hours.  Explained to parents how to measure out formula amounts.  Mom not receptive to teaching but dad was receptive.  Encouraged parents to call RN at next feeding for assistance with latch.  Latch-on 1,2,3 sheet given to RN for teaching at next feeding.  LC discussed consult interaction with RN and gave sheet.  Consult Status Consult Status: Follow-up Date: 05/02/14 Follow-up type: In-patient    Shelley Chen, Shelley Chen 05/01/2014, 9:44 PM

## 2014-05-01 NOTE — Progress Notes (Signed)
I have seen and examined this patient and I agree with the above. Cam HaiSHAW, Anikka Marsan CNM 12:08 PM 05/01/2014

## 2014-05-01 NOTE — Progress Notes (Signed)
UR chart review completed.  

## 2014-05-02 LAB — TYPE AND SCREEN
ABO/RH(D): O POS
Antibody Screen: NEGATIVE
UNIT DIVISION: 0
Unit division: 0

## 2014-05-02 MED ORDER — IBUPROFEN 600 MG PO TABS: 600.0000 mg | ORAL_TABLET | Freq: Four times a day (QID) | ORAL | Status: DC | PRN

## 2014-05-02 MED ORDER — OXYCODONE-ACETAMINOPHEN 5-325 MG PO TABS: 1.0000 | ORAL_TABLET | ORAL | Status: DC | PRN

## 2014-05-02 MED ORDER — FERROUS SULFATE 325 (65 FE) MG PO TABS: 325.0000 mg | ORAL_TABLET | Freq: Two times a day (BID) | ORAL | Status: DC

## 2014-05-02 NOTE — Discharge Summary (Signed)
Obstetric Discharge Summary Reason for Admission: cesarean section - scheduled repeat Prenatal Procedures: none Intrapartum Procedures: C/S High Transverse with lysis of adhesions and BTS with Filshie clips Postpartum Procedures: none Complications-Operative and Postpartum: none Hemoglobin  Date Value Ref Range Status  05/01/2014 8.3* 12.0 - 15.0 g/dL Final     HCT  Date Value Ref Range Status  05/01/2014 26.4* 36.0 - 46.0 % Final    Physical Exam:  General: alert, cooperative and mild distress Heart: RRR Lungs: nl effort Lochia: appropriate Uterine Fundus: firm Incision: vert incision with honeycomb dsg; sm staining but unchanged DVT Evaluation: No evidence of DVT seen on physical exam.  Discharge Diagnoses: Term Pregnancy-delivered  Discharge Information: Date: 05/02/2014 Activity: pelvic rest Diet: routine Medications: Ibuprofen, Iron and Percocet Condition: stable Instructions: refer to practice specific booklet Discharge to: home Follow-up Information   [facility] [place] Schedule an appointment as soon as possible for a visit in 4 weeks. (For your postpartum appointment.)    Contact information:   [address] [place] [address] [phone]      [facility] On [facility]/2014. ([facility] for staple removal)    Contact information:   [address] [id number] [employer] [place] [address] [phone]      Newborn Data: Live born female  Birth Weight: 8 lb 9.9 oz (3909 g) APGAR: 9, 9  Home with mother. Breastfeeding.  Cam HaiSHAW, Dwayn Moravek CNM 05/02/2014, 7:47 AM

## 2014-05-02 NOTE — Progress Notes (Signed)
Notified that patient complaining of left calf pain: - no hx of DVT/PE, no fam hx of coagulopathy/DVT/PE  BP 93/44  Pulse 60  Temp(Src) 98.1 F (36.7 C) (Oral)  Resp 17  Wt 78.926 kg (174 lb)  SpO2 97%  LMP 06/05/2013  Breastfeeding? Unknown Extr: trace edema BLE, calves symmetrical, no erythema/induration, mild TTP both calves with right only mildly increased comparatively.  Very low suspicion of DVT, pt advised of risks and need for re-evaluation should symptoms change/worsen  Perry MountACOSTA,Sakai Wolford ROCIO, MD 11:51 AM

## 2014-05-02 NOTE — Discharge Instructions (Signed)
Parto por cesrea - Cuidados posteriores  (Cesarean Delivery, Care After) Siga estas instrucciones durante las prximas semanas. Estas indicaciones le proporcionan informacin general acerca de cmo deber cuidarse despus del procedimiento. El mdico tambin podr darle instrucciones ms especficas. El tratamiento se ha planificado de acuerdo a las prcticas mdicas actuales, pero a veces se producen problemas. Comunquese con el mdico si tiene algn problema o tiene dudas cuando vuelva a su casa.  INSTRUCCIONES PARA EL CUIDADO EN EL HOGAR  Tome slo medicamentos de venta libre o recetados, segn las indicaciones del mdico.  No beba alcohol, especialmente si est amamantando o toma analgsicos.  Nomastique tabaco ni fume.  Contine con un adecuado cuidado perineal. El buen cuidado perineal incluye:  Higienizarse de adelante hacia atrs.  Mantener la zona perineal limpia.  Controlar diariamente el corte (incisin) y observar si aumenta el enrojecimiento, si supura, se hincha o se separa la piel.  Limpie la incisin suavemente con jabn y agua todos los das, y luego squela dando golpecitos. Si el mdico la autoriza, deje la incisin al descubierto. Use un apsito (vendaje) si drena lquido o la incisin parece irritada. Si las pequeas tiras [facility] que cruzan la incisin no se caen dentro de los 7 das, retrelas suavemente.  Abrace una almohada al toser o estornudar hasta que la incisin se cure. Esto ayuda a Best boy.  No conduzca vehculos ni opere maquinarias hasta que el mdico la autorice.  Dchese, lvese el cabello y tome baos de inmersin segn las indicaciones de su mdico.  Utilice un sostn que le ajuste bien y que brinde buen soporte a sus Glass blower/designer.  Limite el uso de bombachas de sostn o medias panty.  Beba suficiente lquido para Consulting civil engineer orina clara o de color amarillo plido.  Consuma todos los das alimentos ricos en fibra como cereales y panes  [place], arroz, frijoles y frutas frescas y verduras. Estos alimentos pueden ayudarla a prevenir o Cytogeneticist.  Reanude las actividades como subir escaleras, conducir automviles, levantar objetos pesados, hacer ejercicios o viajar cuando le indique su mdico.  Hable con su mdico acerca de reanudar la actividad sexual. Volver a la actividad sexual depende del riesgo de infeccin, la velocidad de la curacin y la comodidad y su deseo de Financial controller.  Trate de que alguien la ayude con las actividades del hogar y con el recin nacido al menos durante algunos das despus de salir del hospital.  Descanse todo lo que pueda. Trate de descansar o tomar una siesta mientras el beb est durmiendo.  Aumente sus actividades gradualmente.  Cumpla con todos los controles programados para despus del [place]. Es muy importante asistir a todas las visitas de Nurse, adult. En estas visitas, su mdico va a controlarla para asegurarse de que est sanando fsica y emocionalmente. SOLICITE ATENCIN MDICA SI:   Elimina cogulos grandes por la vagina. Guarde algunos cogulos para mostrarle al mdico.  Tiene una secrecin con feo olor que proviene de la vagina.  Tiene dificultad para orinar.  Orina con frecuencia.  Siente dolor al [employer].  Nota un cambio en sus movimientos intestinales.  Aumenta el enrojecimiento, el dolor o la hinchazn en la zona de la incisin.  Observa que supura pus en la incisin.  La incisin se abre.  Sus [employer] duelen, estn duras o enrojecidas.  Sufre un dolor intenso de [place].  Tiene visin borrosa o ve manchas.  Se siente triste o deprimida.  Tiene pensamientos acerca de lastimarse o daar al  recin nacido.  Tiene preguntas acerca de su cuidado, la atencin del recin nacido o acerca de los medicamentos.  Se siente mareada o sufre un desmayo.  Tiene una erupcin.  Siente dolor u observa enrojecimiento o hinchazn en el sitio en que  estaba la va intravenosa (IV).  Tiene nuseas o vmitos.  Usted dej de amamantar al beb y no ha tenido su perodo menstrual dentro de las 12 semanas siguientes.  No amamanta al beb y no tuvo su perodo menstrual en las ltimas 12 semanas.  Tiene fiebre. SOLICITE ATENCIN MDICA DE INMEDIATO SI:   Siente dolor persistente.  Siente dolor en el pecho.  Le falta el aire.  Se desmaya.  Siente dolor en la pierna.  Siente Research scientist (life sciences).  El sangrado vaginal satura dos o ms apsitos en 1 hora. ASEGRESE DE QUE:   Comprende estas instrucciones.  Controlar su enfermedad.  Recibir ayuda de inmediato si no mejora o si empeora. Document Released: 09/25/2005 Document Revised: 02/09/2014 [facility] Patient Information 2015 [place], [place]. This information is not intended to replace advice given to you by your health care provider. Make sure you discuss any questions you have with your health care provider.

## 2014-05-03 NOTE — Discharge Summary (Signed)
Attestation of Attending Supervision of Advanced Practitioner: Evaluation and management procedures were performed by the PA/NP/CNM/OB Fellow under my supervision/collaboration. Chart reviewed and agree with management and plan.  Dagoberto Nealy V 05/03/2014 6:39 AM

## 2014-05-08 ENCOUNTER — Encounter (HOSPITAL_COMMUNITY): Payer: Self-pay | Admitting: *Deleted

## 2014-05-08 ENCOUNTER — Inpatient Hospital Stay (HOSPITAL_COMMUNITY)
Admission: AD | Admit: 2014-05-08 | Discharge: 2014-05-08 | Disposition: A | Discharge status: Home / Self Care | Payer: Medicaid Other | Source: Ambulatory Visit | Attending: Family Medicine | Admitting: Family Medicine

## 2014-05-08 DIAGNOSIS — Z4802 Encounter for removal of sutures: Secondary | ICD-10-CM | POA: Insufficient documentation

## 2014-05-08 NOTE — MAU Note (Signed)
Told to come to MAU for staple removal;

## 2014-05-12 ENCOUNTER — Inpatient Hospital Stay (HOSPITAL_COMMUNITY)
Admission: AD | Admit: 2014-05-12 | Discharge: 2014-05-12 | Disposition: A | Discharge status: Home / Self Care | Payer: Medicaid Other | Source: Ambulatory Visit | Attending: Obstetrics & Gynecology | Admitting: Obstetrics & Gynecology

## 2014-05-12 ENCOUNTER — Encounter (HOSPITAL_COMMUNITY): Payer: Self-pay | Admitting: *Deleted

## 2014-05-12 DIAGNOSIS — N3000 Acute cystitis without hematuria: Secondary | ICD-10-CM

## 2014-05-12 DIAGNOSIS — N3001 Acute cystitis with hematuria: Secondary | ICD-10-CM

## 2014-05-12 DIAGNOSIS — N390 Urinary tract infection, site not specified: Secondary | ICD-10-CM | POA: Insufficient documentation

## 2014-05-12 LAB — URINALYSIS, ROUTINE W REFLEX MICROSCOPIC
Bilirubin Urine: NEGATIVE
Glucose, UA: NEGATIVE mg/dL
Ketones, ur: NEGATIVE mg/dL
NITRITE: NEGATIVE
Protein, ur: NEGATIVE mg/dL
Specific Gravity, Urine: 1.025 (ref 1.005–1.030)
Urobilinogen, UA: 0.2 mg/dL (ref 0.0–1.0)
pH: 5.5 (ref 5.0–8.0)

## 2014-05-12 LAB — WET PREP, GENITAL
Clue Cells Wet Prep HPF POC: NONE SEEN
TRICH WET PREP: NONE SEEN
YEAST WET PREP: NONE SEEN

## 2014-05-12 LAB — URINE MICROSCOPIC-ADD ON

## 2014-05-12 MED ORDER — PHENAZOPYRIDINE HCL 200 MG PO TABS: 200.0000 mg | ORAL_TABLET | Freq: Three times a day (TID) | ORAL | Status: DC

## 2014-05-12 MED ORDER — CEPHALEXIN 500 MG PO CAPS
500.0000 mg | ORAL_CAPSULE | Freq: Four times a day (QID) | ORAL | Status: DC
Start: 2014-05-12 — End: 2015-12-19

## 2014-05-12 NOTE — Discharge Instructions (Signed)

## 2014-05-12 NOTE — MAU Provider Note (Signed)
CSN: [id number]     Arrival date & time 05/12/14  1126 History   None    Chief Complaint  Patient presents with  . Dysuria     (Consider location/radiation/quality/duration/timing/severity/associated sxs/prior Treatment) Patient is a 23 y.o. female presenting with dysuria. The history is provided by the patient. No language interpreter was used.  Dysuria  This is a new problem. The current episode started more than 2 days ago. The problem occurs every urination. The problem has been gradually worsening. The quality of the pain is described as burning. The pain is at a severity of 8/10. There has been no fever. She is not sexually active. Associated symptoms include frequency and urgency. Pertinent negatives include no chills, no nausea and no vomiting.   Lajada Janes is a 23 y.o. female who presents to the MAU with dysuria. She denies frequency or urgency. She is 2 weeks s/p C/S. She reports not having had sexual intercourse since delivery. She has had small amount of vaginal bleeding.    Past Medical History  Diagnosis Date  . No pertinent past medical history    Past Surgical History  Procedure Laterality Date  . Cesarean section      C/S x 2  . Dilation and evacuation  11/12/2012    Procedure: DILATATION AND EVACUATION;  Surgeon: Reva Bores, MD;  Location: [facility];  Service: Gynecology;  Laterality: N/A;  . Cesarean section with bilateral tubal ligation Bilateral 04/30/2014    Procedure: REPEAT CESAREAN SECTION WITH BILATERAL TUBAL LIGATION;  Surgeon: Tereso Newcomer, MD;  Location: [facility];  Service: Obstetrics;  Laterality: Bilateral;  . Lysis of adhesion N/A 04/30/2014    Procedure: LYSIS OF ADHESION;  Surgeon: Tereso Newcomer, MD;  Location: [facility];  Service: Obstetrics;  Laterality: N/A;   History reviewed. No pertinent family history. History  Substance Use Topics  . Smoking status: Never Smoker   . Smokeless tobacco: Not on file  . Alcohol Use: No   OB History    Grav Para Term Preterm Abortions TAB SAB Ect Mult Living   4 3 2  1   1  3      Review of Systems  Constitutional: Negative for chills.  Gastrointestinal: Negative for nausea and vomiting.  Genitourinary: Positive for dysuria, urgency and frequency.  All other systems negative.    Allergies  Review of patient's allergies indicates no known allergies.  Home Medications   Prior to Admission medications   Medication Sig Start Date End Date Taking? Authorizing Provider  ferrous sulfate (FERROUSUL) 325 (65 FE) MG tablet Take 1 tablet (325 mg total) by mouth 2 (two) times daily with a meal. 05/02/14  Yes [name], CNM  ibuprofen (ADVIL,MOTRIN) 600 MG tablet Take 1 tablet (600 mg total) by mouth every 6 (six) hours as needed for mild pain. 05/02/14  Yes [name], CNM  Multiple Vitamins-Minerals (MULTIVITAMIN) tablet Take 1 tablet by mouth daily. 07/01/13  Yes Ripudeep Jenna Luo, MD  oxyCODONE-acetaminophen (PERCOCET/ROXICET) 5-325 MG per tablet Take 1-2 tablets by mouth every 4 (four) hours as needed for severe pain (moderate - severe pain). 05/02/14  Yes [name], CNM   BP 104/59  Pulse 78  Temp(Src) 98.9 F (37.2 C) (Oral)  Resp 16  SpO2 99%  LMP 06/05/2013  Breastfeeding? Unknown Physical Exam  Nursing note and vitals reviewed. Constitutional: She is oriented to person, place, and time. She appears well-developed and well-nourished.  HENT:  Head: Normocephalic.  Eyes:  EOM are normal.  Neck: Neck supple.  Cardiovascular: Normal rate.   Pulmonary/Chest: Effort normal.  Abdominal: Soft. There is tenderness (mild at c/s incision).  Healing c/s incision without signs of infection.   Genitourinary:  External genitalia without lesions. Scant dark blood vaginal vault. No CMT, no adnexal tenderness.   Musculoskeletal: Normal range of motion.  Neurological: She is alert and oriented to person, place, and time. No cranial nerve deficit.  Skin: Skin is warm and dry.   Psychiatric: She has a normal mood and affect. Her behavior is normal.    ED Course  Procedures (including critical care time) Labs Review Results for orders placed during the hospital encounter of 05/12/14 (from the past 24 hour(s))  URINALYSIS, ROUTINE W REFLEX MICROSCOPIC     Status: Abnormal   Collection Time    05/12/14 12:10 PM      Result Value Ref Range   Color, Urine YELLOW  YELLOW   APPearance CLEAR  CLEAR   Specific Gravity, Urine 1.025  1.005 - 1.030   pH 5.5  5.0 - 8.0   Glucose, UA NEGATIVE  NEGATIVE mg/dL   Hgb urine dipstick MODERATE (*) NEGATIVE   Bilirubin Urine NEGATIVE  NEGATIVE   Ketones, ur NEGATIVE  NEGATIVE mg/dL   Protein, ur NEGATIVE  NEGATIVE mg/dL   Urobilinogen, UA 0.2  0.0 - 1.0 mg/dL   Nitrite NEGATIVE  NEGATIVE   Leukocytes, UA SMALL (*) NEGATIVE  URINE MICROSCOPIC-ADD ON     Status: Abnormal   Collection Time    05/12/14 12:10 PM      Result Value Ref Range   Squamous Epithelial / LPF RARE  RARE   WBC, UA 7-10  <3 WBC/hpf   RBC / HPF 0-2  <3 RBC/hpf   Bacteria, UA FEW (*) RARE  WET PREP, GENITAL     Status: Abnormal   Collection Time    05/12/14  1:47 PM      Result Value Ref Range   Yeast Wet Prep HPF POC NONE SEEN  NONE SEEN   Trich, Wet Prep NONE SEEN  NONE SEEN   Clue Cells Wet Prep HPF POC NONE SEEN  NONE SEEN   WBC, Wet Prep HPF POC FEW (*) NONE SEEN      MDM  23 y.o. female with dysuria. Will treat for UTI, urine sent for culture. GC, Chlamydia cultures sent. Patient stable for discharge without signs of pyelo. Discussed with the patient and all questioned fully answered. She will return if any problems arise.    Medication List         cephALEXin 500 MG capsule  Commonly known as:  KEFLEX  Take 1 capsule (500 mg total) by mouth 4 (four) times daily.     ferrous sulfate 325 (65 FE) MG tablet  Commonly known as:  FERROUSUL  Take 1 tablet (325 mg total) by mouth 2 (two) times daily with a meal.     ibuprofen 600 MG tablet   Commonly known as:  ADVIL,MOTRIN  Take 1 tablet (600 mg total) by mouth every 6 (six) hours as needed for mild pain.     multivitamin tablet  Take 1 tablet by mouth daily.     oxyCODONE-acetaminophen 5-325 MG per tablet  Commonly known as:  PERCOCET/ROXICET  Take 1-2 tablets by mouth every 4 (four) hours as needed for severe pain (moderate - severe pain).     phenazopyridine 200 MG tablet  Commonly known as:  PYRIDIUM  Take 1  tablet (200 mg total) by mouth 3 (three) times daily.

## 2014-05-12 NOTE — MAU Note (Signed)
C/O burning with urination & pain since Friday night.  C/S on 7/23.  Denies fever.  States incision is fine.

## 2014-05-13 LAB — URINE CULTURE
Colony Count: NO GROWTH
Culture: NO GROWTH

## 2014-05-13 LAB — GC/CHLAMYDIA PROBE AMP
CT PROBE, AMP APTIMA: NEGATIVE
GC Probe RNA: NEGATIVE

## 2014-08-10 ENCOUNTER — Encounter (HOSPITAL_COMMUNITY): Payer: Self-pay | Admitting: *Deleted

## 2014-08-12 ENCOUNTER — Ambulatory Visit: Payer: Medicaid Other

## 2015-01-04 ENCOUNTER — Ambulatory Visit: Payer: Medicaid Other

## 2015-03-05 IMAGING — US US OB COMP LESS 14 WK
1 series · 14 of 28 positions shown · non-contrast
Comparison: Prior pelvic ultrasound 11/12/2012

CLINICAL DATA: Evaluate for dates. Patient states unsure but feels
last menstrual period 06/28/2013 giving a gestational age by last
menstrual period of 17 weeks 2 days.

EXAM:
OBSTETRIC <14 WK ULTRASOUND
TECHNIQUE: Transabdominal ultrasound was performed for evaluation of the
gestation as well as the maternal uterus and adnexal regions.

[Series 1: us ob comp +14 wk · 39 acquisitions, 14 frames shown]
[im 2/39]
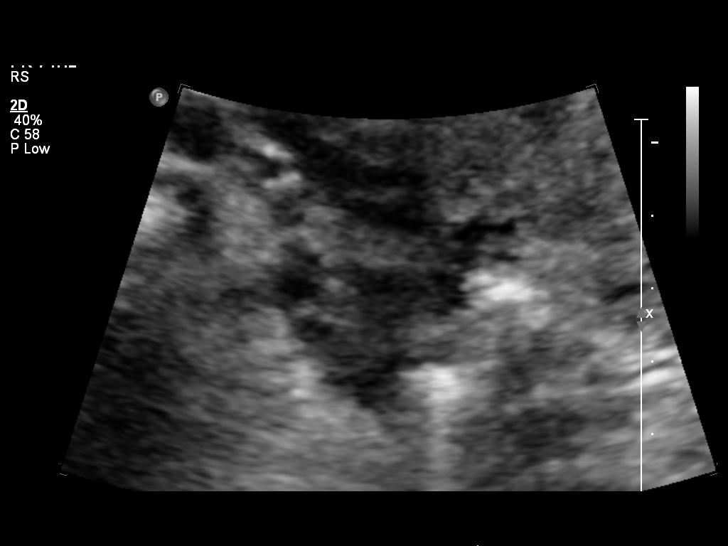
[im 5/39]
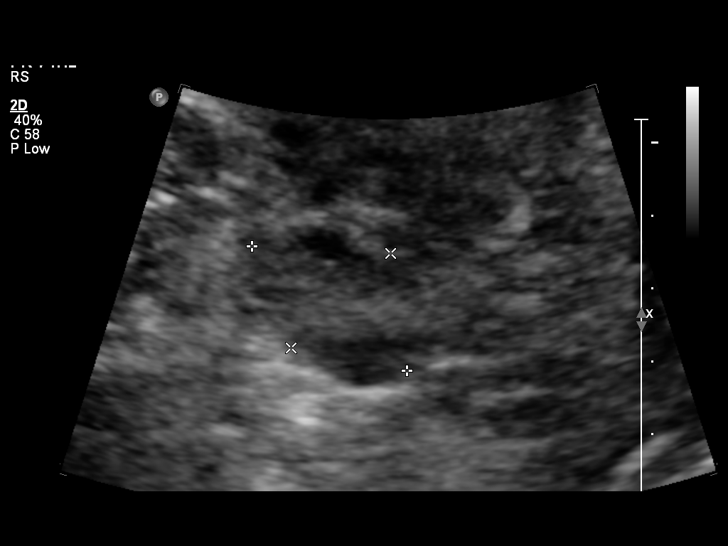
[im 8/39]
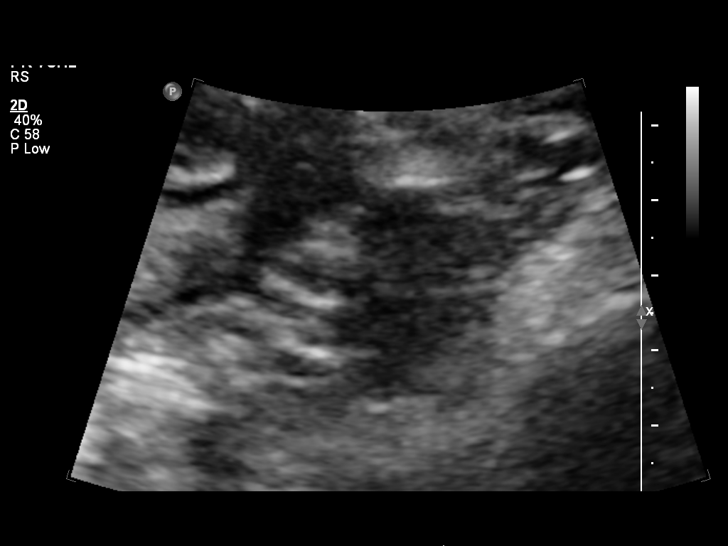
[im 10/39]
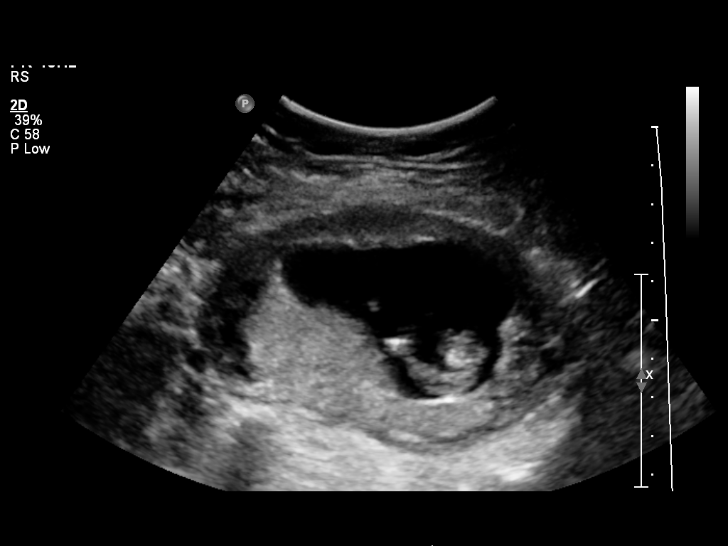
[im 13/39]
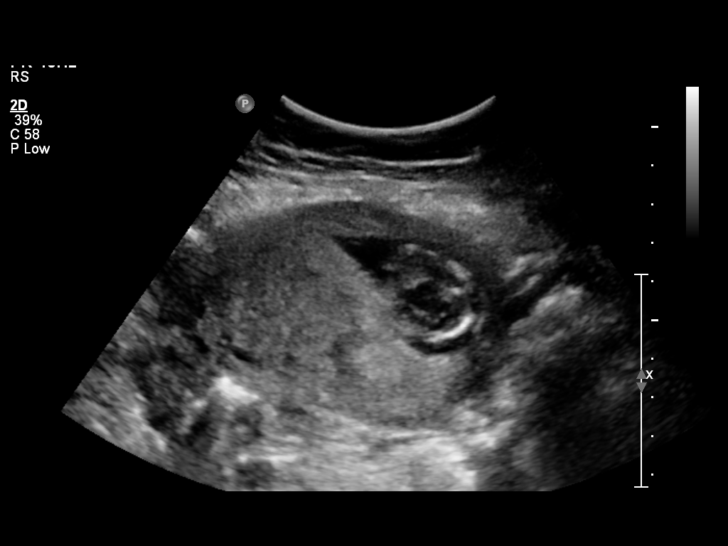
[im 16/39]
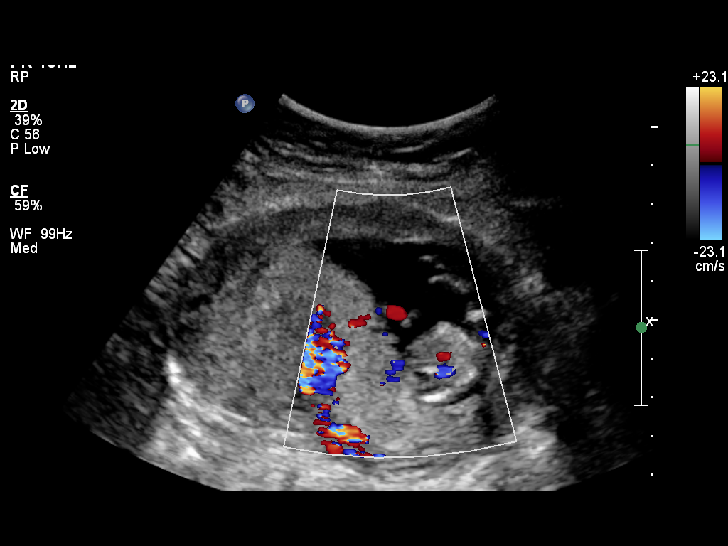
[im 19/39]
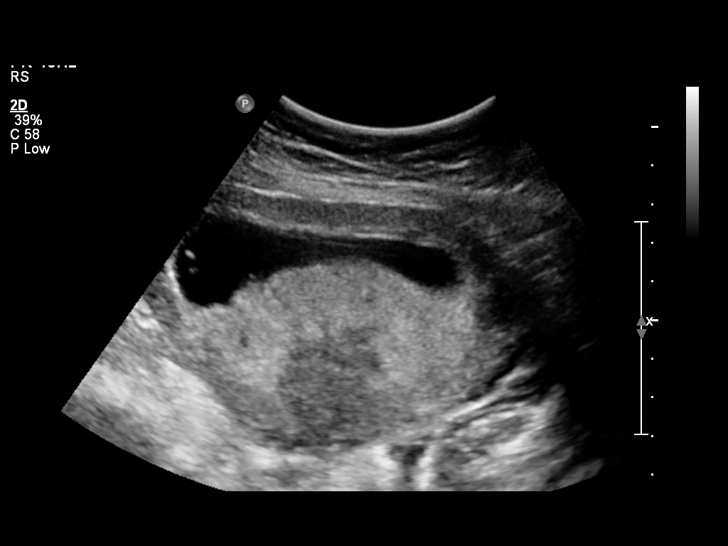
[im 22/39]
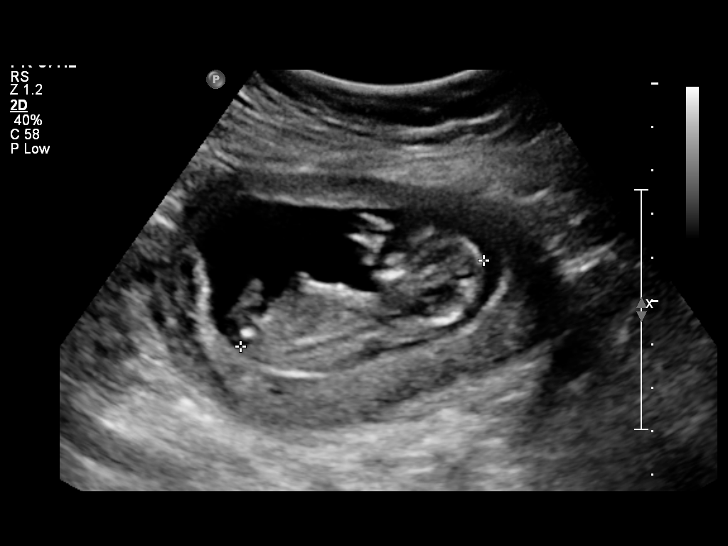
[im 24/39]
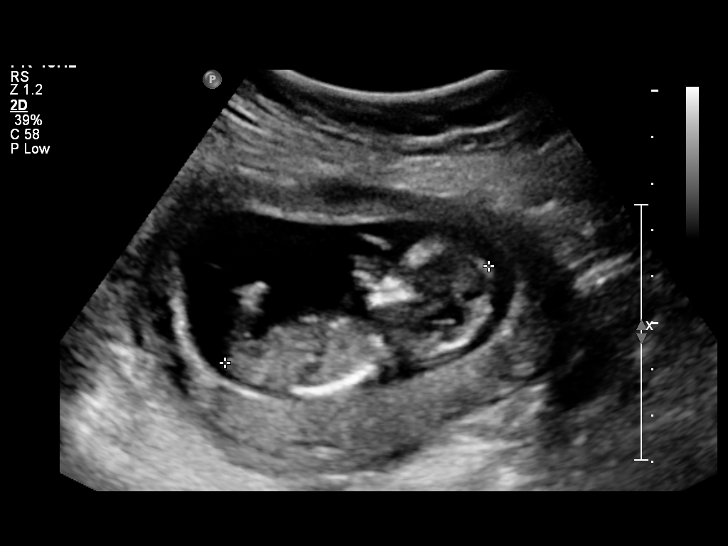
[im 27/39]
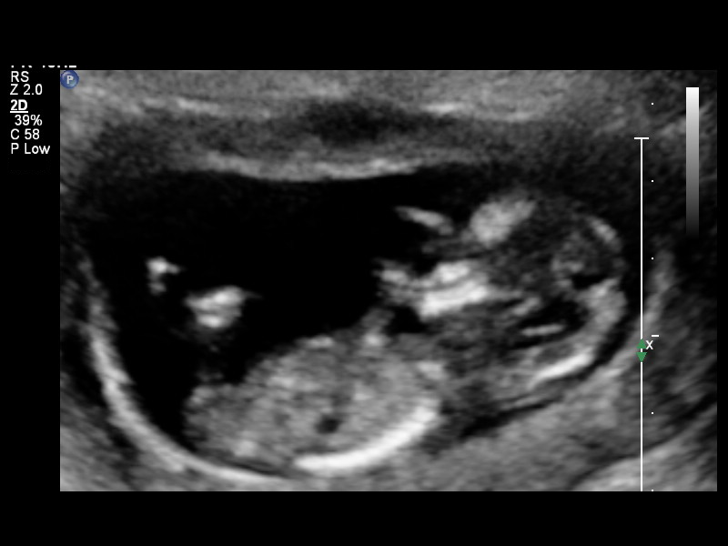
[im 30/39]
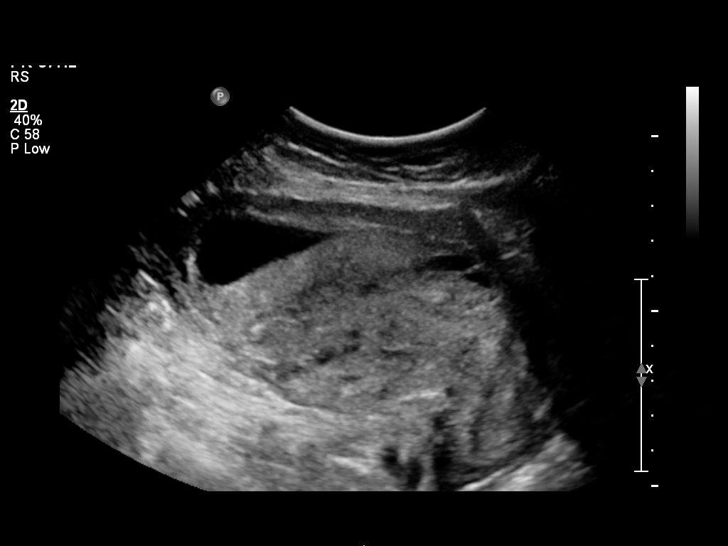
[im 33/39]
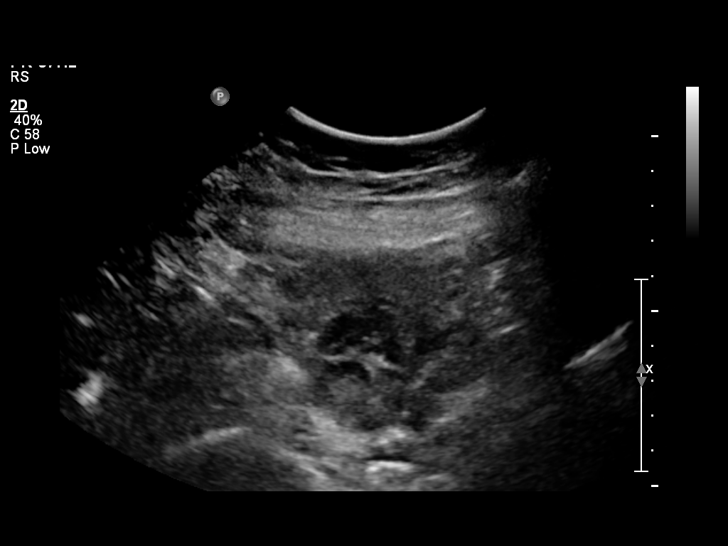
[im 36/39]
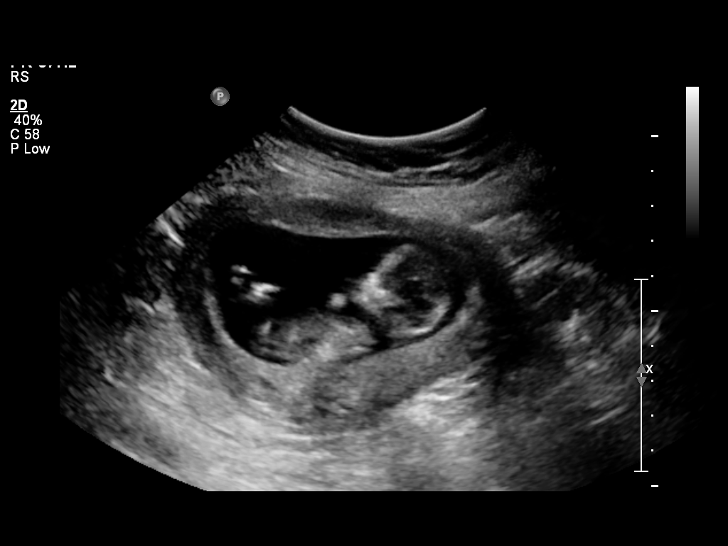
[im 39/39]
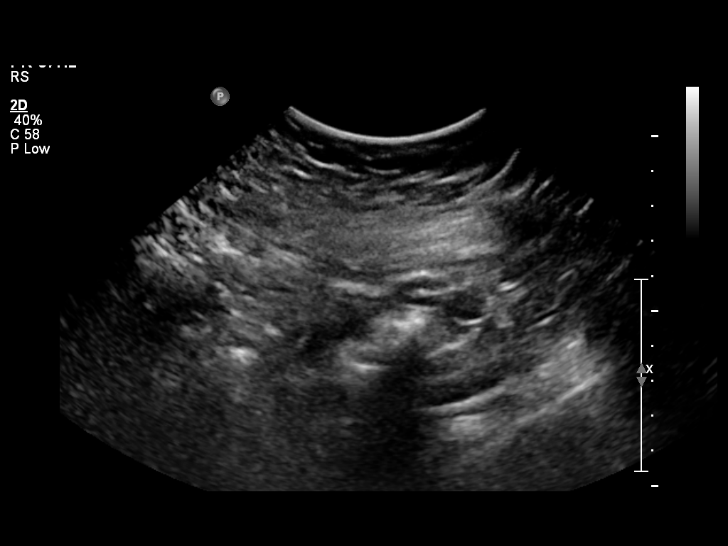

[14 of 28 positions shown; findings below may reference images not displayed]

FINDINGS: Intrauterine gestational sac: Visualized/normal in shape.

Yolk sac:  Not present

Embryo:  Present

Cardiac Activity: Present

Heart Rate: 155 bpm

CRL:   59.4  mm   12 w 4 d                  US EDC: 05/07/2014

Maternal uterus/adnexae: Normal bilateral ovaries. No subchorionic
hematoma. No free fluid.
IMPRESSION: Single live intrauterine gestation 12 weeks 4 days by crown-rump
length.

## 2015-12-02 ENCOUNTER — Ambulatory Visit (INDEPENDENT_AMBULATORY_CARE_PROVIDER_SITE_OTHER): Payer: Self-pay | Admitting: Family Medicine

## 2015-12-02 VITALS — BP 104/68 | HR 69 | Temp 98.5°F | Resp 16 | Ht 66.0 in | Wt 149.0 lb

## 2015-12-02 DIAGNOSIS — R309 Painful micturition, unspecified: Secondary | ICD-10-CM

## 2015-12-02 LAB — POC MICROSCOPIC URINALYSIS (UMFC): Mucus: ABSENT

## 2015-12-02 LAB — POCT URINALYSIS DIP (MANUAL ENTRY)
Bilirubin, UA: NEGATIVE
Glucose, UA: NEGATIVE
Ketones, POC UA: NEGATIVE
Nitrite, UA: NEGATIVE
Protein Ur, POC: 100 — AB
Spec Grav, UA: 1.025
Urobilinogen, UA: 0.2
pH, UA: 6

## 2015-12-02 MED ORDER — NITROFURANTOIN MONOHYD MACRO 100 MG PO CAPS: 100.0000 mg | ORAL_CAPSULE | Freq: Two times a day (BID) | ORAL | Status: DC

## 2015-12-02 NOTE — Progress Notes (Addendum)
By signing my name below, I, [name], attest that this documentation has been prepared under the direction and in the presence of [name], MD. Electronically Signed: [name], Scribe. 12/02/2015 , 4:48 PM .  Patient was seen in room 11 .   Patient ID: Shelley Chen MRN: [id number], DOB: 1991/08/08, 25 y.o. Date of Encounter: 12/02/2015  Primary Physician: Jeanann Lewandowsky, MD  Chief Complaint:  Chief Complaint  Patient presents with  . Vaginal Pain    since Monday  . Dysuria    HPI:  Shelley Chen is a 25 y.o. female who presents to [facility] complaining of vaginal pain with dysuria that started 3 days ago. She noticed some vaginal bleeding 1 week prior and she knows it's not her period. This only occurred once. She also notes having some low abdominal cramping pain in suprapubic area. She denies fever, or back pain.   Past Medical History  Diagnosis Date  . No pertinent past medical history      Home Meds: Prior to Admission medications   Medication Sig Start Date End Date Taking? Authorizing Provider  cephALEXin (KEFLEX) 500 MG capsule Take 1 capsule (500 mg total) by mouth 4 (four) times daily. Patient not taking: Reported on 12/02/2015 05/12/14   [name], NP  ferrous sulfate (FERROUSUL) 325 (65 FE) MG tablet Take 1 tablet (325 mg total) by mouth 2 (two) times daily with a meal. Patient not taking: Reported on 12/02/2015 05/02/14   [name], CNM  ibuprofen (ADVIL,MOTRIN) 600 MG tablet Take 1 tablet (600 mg total) by mouth every 6 (six) hours as needed for mild pain. Patient not taking: Reported on 12/02/2015 05/02/14   [name], CNM  Multiple Vitamins-Minerals (MULTIVITAMIN) tablet Take 1 tablet by mouth daily. Patient not taking: Reported on 12/02/2015 07/01/13   Ripudeep Jenna Luo, MD  oxyCODONE-acetaminophen (PERCOCET/ROXICET) 5-325 MG per tablet Take 1-2 tablets by mouth every 4 (four) hours as needed for  severe pain (moderate - severe pain). Patient not taking: Reported on 12/02/2015 05/02/14   [name], CNM  phenazopyridine (PYRIDIUM) 200 MG tablet Take 1 tablet (200 mg total) by mouth 3 (three) times daily. Patient not taking: Reported on 12/02/2015 05/12/14   [name], NP    Allergies: No Known Allergies  Social History   Social History  . Marital Status: Single    Spouse Name: N/A  . Number of Children: N/A  . Years of Education: N/A   Occupational History  . Not on file.   Social History Main Topics  . Smoking status: Never Smoker   . Smokeless tobacco: Not on file  . Alcohol Use: No  . Drug Use: No  . Sexual Activity: Yes    Birth Control/ Protection: Surgical   Other Topics Concern  . Not on file   Social History Narrative     Review of Systems: Constitutional: negative for fever, chills, night sweats, weight changes, or fatigue HEENT: negative for vision changes, hearing loss, congestion, rhinorrhea, ST, epistaxis, or sinus pressure Cardiovascular: negative for chest pain or palpitations Respiratory: negative for hemoptysis, wheezing, shortness of breath, or cough Abdominal: negative for nausea, vomiting, diarrhea, or constipation; positive for abdominal pain Dermatological: negative for rash GU: positive for vaginal pain, dysuria, vaginal bleeding Neurologic: negative for headache, dizziness, or syncope All other systems reviewed and are otherwise negative with the exception to those above and in the HPI.  Physical Exam: Blood pressure 104/68, pulse  69, temperature 98.5 F (36.9 C), resp. rate 16, height  (1.676 m), weight 149 lb (67.586 kg), last menstrual period 11/01/2015, SpO2 99 %, unknown if currently breastfeeding., Body mass index is 24.06 kg/(m^2). General: Well developed, well nourished, in no acute distress. Head: Normocephalic, atraumatic, eyes without discharge, sclera non-icteric, nares are without discharge. Bilateral auditory canals  clear, TM's are without perforation, pearly grey and translucent with reflective cone of light bilaterally. Oral cavity moist, posterior pharynx without exudate, erythema, peritonsillar abscess, or post nasal drip.  Neck: Supple. No thyromegaly. Full ROM. No lymphadenopathy. Lungs: Clear bilaterally to auscultation without wheezes, rales, or rhonchi. Breathing is unlabored. Heart: RRR with S1 S2. No murmurs, rubs, or gallops appreciated. Abdomen: Soft, non-tender, non-distended with normoactive bowel sounds. No hepatomegaly. No rebound/guarding. No obvious abdominal masses. Msk:  Strength and tone normal for age. Extremities/Skin: Warm and dry. No clubbing or cyanosis. No edema. No rashes or suspicious lesions. Neuro: Alert and oriented X 3. Moves all extremities spontaneously. Gait is normal. CNII-XII grossly in tact. Psych:  Responds to questions appropriately with a normal affect.   Labs: Results for orders placed or performed during the hospital encounter of 05/12/14  Urine culture  Result Value Ref Range   Specimen Description URINE, CLEAN CATCH    Special Requests NONE    Culture  Setup Time      05/12/2014 19:35 Performed at [facility]   Colony Count NO GROWTH Performed at [facility]    Culture NO GROWTH Performed at [facility]    Report Status 05/13/2014 FINAL   GC/Chlamydia Probe Amp  Result Value Ref Range   CT Probe RNA NEGATIVE NEGATIVE   GC Probe RNA NEGATIVE NEGATIVE  Wet prep, genital  Result Value Ref Range   Yeast Wet Prep HPF POC NONE SEEN NONE SEEN   Trich, Wet Prep NONE SEEN NONE SEEN   Clue Cells Wet Prep HPF POC NONE SEEN NONE SEEN   WBC, Wet Prep HPF POC FEW (A) NONE SEEN  Urinalysis, Routine w reflex microscopic  Result Value Ref Range   Color, Urine YELLOW YELLOW   APPearance CLEAR CLEAR   Specific Gravity, Urine 1.025 1.005 - 1.030   pH 5.5 5.0 - 8.0   Glucose, UA NEGATIVE NEGATIVE mg/dL   Hgb urine dipstick MODERATE (A)  NEGATIVE   Bilirubin Urine NEGATIVE NEGATIVE   Ketones, ur NEGATIVE NEGATIVE mg/dL   Protein, ur NEGATIVE NEGATIVE mg/dL   Urobilinogen, UA 0.2 0.0 - 1.0 mg/dL   Nitrite NEGATIVE NEGATIVE   Leukocytes, UA SMALL (A) NEGATIVE  Urine microscopic-add on  Result Value Ref Range   Squamous Epithelial / LPF RARE RARE   WBC, UA 7-10 <3 WBC/hpf   RBC / HPF 0-2 <3 RBC/hpf   Bacteria, UA FEW (A) RARE   Results for orders placed or performed in visit on 12/02/15  POCT Microscopic Urinalysis ([facility])  Result Value Ref Range   WBC,UR,HPF,POC Many (A) None WBC/hpf   RBC,UR,HPF,POC Moderate (A) None RBC/hpf   Bacteria Few (A) None, Too numerous to count   Mucus Absent Absent   Epithelial Cells, UR Per Microscopy Few (A) None, Too numerous to count cells/hpf  POCT urinalysis dipstick  Result Value Ref Range   Color, UA yellow yellow   Clarity, UA cloudy (A) clear   Glucose, UA negative negative   Bilirubin, UA negative negative   Ketones, POC UA negative negative   Spec Grav, UA 1.025    Blood,  UA moderate (A) negative   pH, UA 6.0    Protein Ur, POC =100 (A) negative   Urobilinogen, UA 0.2    Nitrite, UA Negative Negative   Leukocytes, UA moderate (2+) (A) Negative    ASSESSMENT AND PLAN:  25 y.o. year old female with  This chart was scribed in my presence and reviewed by me personally.    ICD-9-CM ICD-10-CM   1. Painful urination 788.1 R30.9 POCT Microscopic Urinalysis ([facility])     POCT urinalysis dipstick      Signed, [name], MD 12/02/2015 4:48 PM

## 2015-12-02 NOTE — Patient Instructions (Signed)

## 2015-12-02 NOTE — Addendum Note (Signed)
Addended by: Maurene Capes on: 12/02/2015 05:25 PM   Modules accepted: Level of Service, SmartSet

## 2015-12-12 ENCOUNTER — Ambulatory Visit (INDEPENDENT_AMBULATORY_CARE_PROVIDER_SITE_OTHER): Payer: Self-pay | Admitting: Family Medicine

## 2015-12-12 VITALS — BP 116/70 | HR 67 | Temp 98.2°F | Resp 18 | Wt 151.2 lb

## 2015-12-12 DIAGNOSIS — Z98891 History of uterine scar from previous surgery: Secondary | ICD-10-CM

## 2015-12-12 DIAGNOSIS — N920 Excessive and frequent menstruation with regular cycle: Secondary | ICD-10-CM

## 2015-12-12 DIAGNOSIS — N946 Dysmenorrhea, unspecified: Secondary | ICD-10-CM

## 2015-12-12 LAB — POCT CBC
Granulocyte percent: 60.2 %G (ref 37–80)
HCT, POC: 35.8 % — AB (ref 37.7–47.9)
Hemoglobin: 12 g/dL — AB (ref 12.2–16.2)
LYMPH, POC: 3.2 (ref 0.6–3.4)
MCH: 26.8 pg — AB (ref 27–31.2)
MCHC: 33.5 g/dL (ref 31.8–35.4)
MCV: 80 fL (ref 80–97)
MID (cbc): 0.4 (ref 0–0.9)
MPV: 9.2 fL (ref 0–99.8)
POC Granulocyte: 5.5 (ref 2–6.9)
POC LYMPH PERCENT: 35.2 %L (ref 10–50)
POC MID %: 4.6 % (ref 0–12)
Platelet Count, POC: 204 10*3/uL (ref 142–424)
RBC: 4.47 M/uL (ref 4.04–5.48)
RDW, POC: 14 %
WBC: 9.2 10*3/uL (ref 4.6–10.2)

## 2015-12-12 LAB — POCT URINE PREGNANCY: Preg Test, Ur: NEGATIVE

## 2015-12-12 MED ORDER — IBUPROFEN 800 MG PO TABS: 800.0000 mg | ORAL_TABLET | Freq: Three times a day (TID) | ORAL | Status: DC | PRN

## 2015-12-12 MED ORDER — NORGESTIMATE-ETH ESTRADIOL 0.25-35 MG-MCG PO TABS: 1.0000 | ORAL_TABLET | Freq: Every day | ORAL | Status: DC

## 2015-12-12 NOTE — Progress Notes (Signed)
Subjective:    Patient ID: Shelley Chen, female    DOB: 07/29/1991, 25 y.o.   MRN: [id number]  12/12/2015  Heavy Period   HPI This 25 y.o. female presents for evaluation of menorrhagia and dysmenorrhea. Onset two days ago; normal time for menses.  Changing pad must run to restroom due to clots and blood.  If stands up, must go immediately to restroom.  Has a toddler, must rush to the restroom constantly.  Every five minutes running to restroom.  Every menses suffers with heavy menses.  S/p BTL; s/p 3 C-sections.  No other sites of bleeding.  Changed pad 3-4 times last night.  Usually soils pands and take shower.  Like this every month since six month.  No gynecologist.  Same partner x 8 years.  No new partners.  No STDs in the past.  No vaginal discharge; was having UTI two weeks ago; now improved.  Homemaker.  No family history of blood clots/DVT/pulmonary embolism.  No previous contraception.  Last pap smear 1.5 years ago; WNL.     Review of Systems  Constitutional: Negative for fever, chills, diaphoresis and fatigue.  Eyes: Negative for visual disturbance.  Respiratory: Negative for cough and shortness of breath.   Cardiovascular: Negative for chest pain, palpitations and leg swelling.  Gastrointestinal: Negative for nausea, vomiting, abdominal pain, diarrhea and constipation.  Endocrine: Negative for cold intolerance, heat intolerance, polydipsia, polyphagia and polyuria.  Genitourinary: Positive for vaginal bleeding, menstrual problem and pelvic pain. Negative for dysuria, urgency, frequency, hematuria, flank pain, decreased urine volume, vaginal discharge, genital sores and vaginal pain.  Neurological: Negative for dizziness, tremors, seizures, syncope, facial asymmetry, speech difficulty, weakness, light-headedness, numbness and headaches.    Past Medical History  Diagnosis Date  . No pertinent past medical history    Past Surgical History  Procedure Laterality Date  .  Cesarean section      C/S x 2  . Dilation and evacuation  11/12/2012    Procedure: DILATATION AND EVACUATION;  Surgeon: Reva Bores, MD;  Location: [facility];  Service: Gynecology;  Laterality: N/A;  . Cesarean section with bilateral tubal ligation Bilateral 04/30/2014    Procedure: REPEAT CESAREAN SECTION WITH BILATERAL TUBAL LIGATION;  Surgeon: Tereso Newcomer, MD;  Location: [facility];  Service: Obstetrics;  Laterality: Bilateral;  . Lysis of adhesion N/A 04/30/2014    Procedure: LYSIS OF ADHESION;  Surgeon: Tereso Newcomer, MD;  Location: [facility];  Service: Obstetrics;  Laterality: N/A;   No Known Allergies  Social History   Social History  . Marital Status: Single    Spouse Name: N/A  . Number of Children: N/A  . Years of Education: N/A   Occupational History  . Not on file.   Social History Main Topics  . Smoking status: Never Smoker   . Smokeless tobacco: Not on file  . Alcohol Use: No  . Drug Use: No  . Sexual Activity: Yes    Birth Control/ Protection: Surgical   Other Topics Concern  . Not on file   Social History Narrative   History reviewed. No pertinent family history.     Objective:    BP 116/70 mmHg  Pulse 67  Temp(Src) 98.2 F (36.8 C) (Oral)  Resp 18  Wt 151 lb 3.2 oz (68.584 kg)  SpO2 99%  LMP 12/10/2015 Physical Exam  Constitutional: She is oriented to person, place, and time. She appears well-developed and well-nourished. No distress.  HENT:  Head: Normocephalic and atraumatic.  Right Ear: External ear normal.  Left Ear: External ear normal.  Nose: Nose normal.  Mouth/Throat: Oropharynx is clear and moist.  Eyes: Conjunctivae and EOM are normal. Pupils are equal, round, and reactive to light.  Neck: Normal range of motion. Neck supple. Carotid bruit is not present. No thyromegaly present.  Cardiovascular: Normal rate, regular rhythm, normal heart sounds and intact distal pulses.  Exam reveals no gallop and no friction rub.   No murmur  heard. Pulmonary/Chest: Effort normal and breath sounds normal. She has no wheezes. She has no rales.  Abdominal: Soft. Bowel sounds are normal. She exhibits no distension and no mass. There is no tenderness. There is no rebound and no guarding.  Genitourinary: Vagina normal and uterus normal. There is no rash, tenderness or lesion on the right labia. There is no rash, tenderness or lesion on the left labia. Cervix exhibits no motion tenderness and no friability. Right adnexum displays no mass, no tenderness and no fullness. Left adnexum displays no mass, no tenderness and no fullness. No bleeding in the vagina. No vaginal discharge found.  Large blood in vaginal vault; removal of blood successful; full inspection of vaginal wall without active bleeding or laceration or lesion. No cervical lesions.    Lymphadenopathy:    She has no cervical adenopathy.  Neurological: She is alert and oriented to person, place, and time. No cranial nerve deficit.  Skin: Skin is warm and dry. No rash noted. She is not diaphoretic. No erythema. No pallor.  Psychiatric: She has a normal mood and affect. Her behavior is normal.   Results for orders placed or performed in visit on 12/12/15  GC/Chlamydia Probe Amp  Result Value Ref Range   CT Probe RNA NOT DETECTED    GC Probe RNA NOT DETECTED   POCT CBC  Result Value Ref Range   WBC 9.2 4.6 - 10.2 K/uL   Lymph, poc 3.2 0.6 - 3.4   POC LYMPH PERCENT 35.2 10 - 50 %L   MID (cbc) 0.4 0 - 0.9   POC MID % 4.6 0 - 12 %M   POC Granulocyte 5.5 2 - 6.9   Granulocyte percent 60.2 37 - 80 %G   RBC 4.47 4.04 - 5.48 M/uL   Hemoglobin 12.0 (A) 12.2 - 16.2 g/dL   HCT, POC 16.135.8 (A) 09.637.7 - 47.9 %   MCV 80.0 80 - 97 fL   MCH, POC 26.8 (A) 27 - 31.2 pg   MCHC 33.5 31.8 - 35.4 g/dL   RDW, POC 04.514.0 %   Platelet Count, POC 204 142 - 424 K/uL   MPV 9.2 0 - 99.8 fL  POCT urine pregnancy  Result Value Ref Range   Preg Test, Ur Negative Negative       Assessment & Plan:    1. Menorrhagia with regular cycle   2. Dysmenorrhea   3. S/P cesarean section and BTS, lysis of adhesions     Orders Placed This Encounter  Procedures  . GC/Chlamydia Probe Amp  . POCT CBC  . POCT urine pregnancy   Meds ordered this encounter  Medications  . ibuprofen (ADVIL,MOTRIN) 800 MG tablet    Sig: Take 1 tablet (800 mg total) by mouth every 8 (eight) hours as needed.    Dispense:  60 tablet    Refill:  5  . DISCONTD: norgestimate-ethinyl estradiol (ORTHO-CYCLEN,SPRINTEC,PREVIFEM) 0.25-35 MG-MCG tablet    Sig: Take 1 tablet by mouth daily.    Dispense:  1 Package  Refill:  11    No Follow-up on file.    Kristi Paulita Fujita, M.D. [facility] [address] [place], [place]  [address] [phone] phone [phone] fax

## 2015-12-12 NOTE — Patient Instructions (Addendum)
Because you received labwork today, you will receive an invoice from [facility]. Please contact [facility] at [phone] with questions or concerns regarding your invoice. Our billing staff will not be able to assist you with those questions.  You will be contacted with the lab results as soon as they are available. The fastest way to get your results is to activate your My Chart account. Instructions are located on the last page of this paperwork. If you have not heard from us regarding the results in 2 weeks, please contact this office.  Menorragia (Menorrhagia) Se llama menorragia a los perodos menstruales abundantes o que duran ms de lo habitual. En la menorragia, la prdida de sangre y los clicos en cada perodo pueden hacerle imposible seguir con sus actividades habituales. CAUSAS  En algunos casos, la causa de los perodos abundantes es desconocida, pero hay algunas afecciones que pueden causar Database administratormenorragia. Las causas ms frecuentes son:  Un problema con la tiroides, que es la glndula productora de hormonas (hipotiroidismo).  Formaciones no cancerosas en el tero (plipos o fibromas).  Un desequilibrio entre las hormonas estrgeno y Education officer, museumprogesterona.  Uno de sus ovarios no libera vulos durante uno o ms meses.  Efectos secundarios por haberse colocado un dispositivo intrauterino (DIU).  Efectos secundarios por algunos medicamentos, como antiinflamatorios o anticoagulantes.  Trastornos hemorrgicos que impiden la Location managercorrecta coagulacin. SIGNOS Y SNTOMAS  Durante un perodo normal, el sangrado dura entre 4 y [address] das. Los signos de que el perodo es muy abundante son:  Maxie BarbDe manera rutinaria tiene que cambiar el apsito o el tampn cada 1 o 2 horas debido a que est completamente empapado.  Elimina cogulos ms grandes de 1 pulgada (2,5 cm).  Tiene sangrado durante ms de 7 das.  Necesita usar apsitos y tampones al mismo tiempo porque pierde General Electricdemasiada  sangre.  Debe levantarse para cambiarse el apsito o el tampn durante la noche.  Tiene sntomas de anemia como cansancio, fatiga o falta de aire. DIAGNSTICO  El Office Depotmdico le har un examen fsico y le har preguntas sobre sus sntomas y su historia menstrual. Podr indicarle otros estudios segn lo que encuentre Programmer, applicationsdurante el examen. Estos estudios pueden ser:  Anlisis de Shannon Hillssangre. Los ARAMARK Corporationanlisis de sangre se usan para verificar si est embarazada o tiene cambios hormonales, un trastorno tiroideo o de sangrado, niveles bajos de hierro (anemia) u otros problemas.  Biopsia de endometrio. El mdico tomar Colombiauna muestra de tejido del interior del tero para que sea examinado con un microscopio.  Ecografa plvica. Este estudio [place] Islandsutiliza ondas de sonido para tomar imgenes del tero, los ovarios y Sales executivela vagina. Las imgenes pueden mostrar si tiene fibromas u otros crecimientos.  Histeroscopa. Para este estudio, el mdico usar un pequeo telescopio para Careers information officermirar el interior del tero. Segn los Terex Corporationresultados de los estudios iniciales, el mdico podr indicar ms MetLifeestudios. TRATAMIENTO  Puede ser que no sea necesario un tratamiento mdico. Si lo necesita, el mdico primero podr recomendarle un tratamiento con uno o ms medicamentos. Si no se reduce el sangrado lo suficiente, el tratamiento quirrgico podra ser Molson Coors Brewinguna opcin. El mejor tratamiento para usted depender de:   Si necesita evitar un embarazo.  Si desea tener hijos en el futuro.  La causa y la gravedad del sangrado.  Su opinin o preferencia personal. Algunos medicamentos para la menorragia son:  Mtodos anticonceptivos que contengan hormonas. Estos incluyen la pldora anticonceptiva, el parche en la piel, el anillo vaginal, las inyecciones que se aplican cada 3 meses,  el DIU hormonal y el implante. Estos tratamientos reducen el sangrado durante el perodo menstrual.  Medicamentos que espesan la sangre y hacen ms lento el sangrado.  Medicamentos que  reducen la inflamacin, como el ibuprofeno.  Medicamentos que contienen una hormona sinttica llamada progestina.  Medicamentos que World Fuel Services Corporation ovarios dejen de funcionar durante un breve lapso. Podra ser necesario un tratamiento quirrgico para la menorragia si los medicamentos no son eficaces. Las opciones de tratamiento incluyen:  Dilatacin y curetaje (D y C). En este procedimiento, el mdico abre (dilata) el cuello del tero y luego raspa o succiona tejido del revestimiento interior del tero para reducir el sangrado menstrual.  Histeroscopa quirrgica. En este procedimiento, se utiliza un pequeo tubo con Nettie Elm (histeroscopio) para observar la cavidad uterina y ayudar en la extirpacin quirrgica de un plipo que puede ser la causa de perodos abundantes.  Ablacin del endometrio. Por medio de BorgWarner, el mdico destruye de [place] todo el revestimiento interno del tero (endometrio). Luego de la ablacin del endometrio, la mayora de las mujeres tienen escaso flujo menstrual, o no lo tienen. La ablacin del endometrio reduce la posibilidad de quedar embarazada.  Reseccin del endometrio. En este procedimiento quirrgico, se [place] un asa de Energy manager para extirpar el revestimiento interno del tero. Este procedimiento tambin reduce la posibilidad de [place].  Histerectoma. La remocin [place] del tero y el cuello del tero es un procedimiento permanente que detiene los perodos [place]. El embarazo no es posible luego de Barista. Este procedimiento requiere de anestesia y hospitalizacin. INSTRUCCIONES PARA EL CUIDADO EN EL HOGAR   Tome solo medicamentos de venta libre o recetados, segn las indicaciones del mdico. Tome todos los medicamentos recetados exactamente como se le indic. No cambie ni reemplace los medicamentos sin consultarlo con el mdico.  Tome los comprimidos de hierro recetados, Education officer, community segn  las indicaciones del mdico. Las hemorragias de larga duracin pueden traer como consecuencia una disminucin en los niveles de hierro. Los comprimidos de hierro ayudan a Restaurant manager, fast food hierro que el organismo pierde luego de un sangrado abundante. El hierro puede causarle estreimiento. Si esto es un problema, aumente el consumo de [place], frutas y [place].  No tome aspirina ni medicamentos que contengan aspirina desde 1 semana antes ni durante el perodo menstrual. La aspirina puede hacer que la hemorragia empeore.  Si necesita cambiar el apsito o el tampn ms de una vez cada 2horas, Surveyor, mining en cama y descanse todo lo posible hasta que la hemorragia se detenga.  Siga una dieta balanceada. Consuma alimentos ricos en hierro. Por ejemplo, vegetales de Marriott, carne, hgado, huevos y panes y Medical laboratory scientific officer de grano entero. No trate de perder peso hasta que la hemorragia anormal se detenga y los niveles de hierro en la sangre vuelvan a la normalidad. SOLICITE ATENCIN MDICA SI:   Empapa un tampn o un apsito cada 1 o 2 horas, y Northrop Grumman ocurre cada vez que tiene el perodo.  Necesita usar apsitos y tampones al mismo tiempo porque pierde Sara Lee.  Debe cambiarse el apsito o el tampn durante la noche.  Tiene un perodo que dura ms de 8 das.  Elimina cogulos de ms de 1 pulgada (2,5 cm).  Tiene perodos irregulares que ocurren ms o menos de una vez al mes.  Se siente mareada o se desmaya.  Se siente muy dbil o cansada.  Le falta el aire o siente que el corazn late muy rpido al hacer ejercicios.  Tiene nuseas y vmitos o diarrea mientras toma los medicamentos.  Tiene algn problema que puede estar relacionado con el medicamento que est tomando. SOLICITE ATENCIN MDICA DE INMEDIATO SI:   Empapa 4 o ms apsitos o tampones en 2 horas.  Tiene sangrado y est embarazada. ASEGRESE DE QUE:   Comprende estas instrucciones.  Controlar su afeccin.  Recibir ayuda de  inmediato si no mejora o si empeora.   Esta informacin no tiene Theme park manager el consejo del mdico. Asegrese de hacerle al mdico cualquier pregunta que tenga.   Document Released: 07/05/2005 Document Revised: 09/30/2013 Elsevier Interactive Patient Education [employer]

## 2015-12-14 LAB — GC/CHLAMYDIA PROBE AMP
CT PROBE, AMP APTIMA: NOT DETECTED
GC PROBE AMP APTIMA: NOT DETECTED

## 2015-12-19 ENCOUNTER — Ambulatory Visit (INDEPENDENT_AMBULATORY_CARE_PROVIDER_SITE_OTHER): Payer: Self-pay | Admitting: Family Medicine

## 2015-12-19 VITALS — BP 102/62 | HR 111 | Temp 98.9°F | Resp 17 | Ht 66.0 in | Wt 150.0 lb

## 2015-12-19 DIAGNOSIS — J039 Acute tonsillitis, unspecified: Secondary | ICD-10-CM

## 2015-12-19 MED ORDER — AMOXICILLIN-POT CLAVULANATE 875-125 MG PO TABS: 1.0000 | ORAL_TABLET | Freq: Two times a day (BID) | ORAL | Status: DC

## 2015-12-19 MED ORDER — MAGIC MOUTHWASH W/LIDOCAINE: 10.0000 mL | ORAL | Status: DC | PRN

## 2015-12-19 NOTE — Patient Instructions (Addendum)
IF you received an x-ray today, you will receive an invoice from [facility]. Please contact [facility] at [phone] with questions or concerns regarding your invoice.   IF you received labwork today, you will receive an invoice from [facility]. Please contact [facility] at [phone] with questions or concerns regarding your invoice.   Our billing staff will not be able to assist you with questions regarding bills from these companies.  You will be contacted with the lab results as soon as they are available. The fastest way to get your results is to activate your My Chart account. Instructions are located on the last page of this paperwork. If you have not heard from us regarding the results in 2 weeks, please contact this office.  Amigdalitis (Tonsillitis) La amigdalitis es una infeccin de la garganta que hace que las amgdalas se tornen rojas, sensibles e hinchadas. Las amgdalas son colecciones de tejido linftico que se encuentran el la zona posterior de la garganta. Cada amgdala tiene grietas (criptas). Ayudan a luchar contra las infecciones de la nariz y la garganta, y a evitar que las infecciones se diseminen a otras partes del cuerpo durante los primeros 18 meses de vida.  CAUSAS Por lo general, la causa de la amigdalitis sbita (aguda) es una infeccin por la bacteria estreptococo. La amigdalitis de larga duracin (crnica) se produce cuando las criptas de las amgdalas se llenan con trozos de alimentos y bacterias, lo que favorece las infecciones constantes. SNTOMAS  Los sntomas de la amigdalitis son:  Dolor de garganta con posible dificultad para tragar.  Placas blancas sobre las amgdalas.  Fiebre.  Cansancio.  Episodios de ronquidos durante el sueo, cuando no los tena anteriormente.  Pequeos trozos de material blanco amarillento (tonsilolitos), de olor ftido, que de vez en cuando se eliminan al toser o escupir. Los  tonsilolitos tambin pueden causarle mal aliento. DIAGNSTICO El diagnstico puede hacerse a travs de un examen fsico. Se confirma con los resultados de las pruebas de laboratorio, incluido un cultivo de secreciones de la garganta. TRATAMIENTO  Los objetivos del tratamiento de la amigdalitis son la reduccin de la gravedad y duracin de los sntomas y prevencin de enfermedades asociadas. Los sntomas pueden mejorar con el uso de corticoides para reducir la hinchazn. La amigdalitis bacteriana se puede tratar con medicamentos antibiticos. Generalmente, el tratamiento con medicamentos antibiticos comienza antes de conocerse la causa. Sin embargo, si se determina que la causa no es bacteriana, los medicamentos antibiticos no curarn la enfermedad. Si los ataques de amigdalitis son graves y frecuentes, el mdico le recomendar la ciruga para extirpar las amgdalas (amigdalectoma). INSTRUCCIONES PARA EL CUIDADO EN EL HOGAR   Descanse y duerma todo lo posible.  Beba abundantes lquidos. Mientras le duela la garganta, consuma alimentos blandos o lquidos, como sorbetes, sopas o bebidas instantneas.  Tome helados de agua.  Puede hacerse grgaras con lquidos tibios o fros para suavizar la garganta. Mezcle 1/4 de cucharadita de sal y 1/4 de cucharadita de bicarbonato de sodio en 8 onzas de agua. SOLICITE ATENCIN MDICA SI:   Le aparecen bultos grandes y dolorosos en el cuello.  Aparece una erupcin cutnea.  Elimina un esputo verde, marrn amarillento o sanguinolento.  No puede tragar lquidos o alimentos durante 24 horas.  Nota que solo una de las amgdalas est hinchada. SOLICITE ATENCIN MDICA DE INMEDIATO SI:   Presenta algn sntoma nuevo, como vmitos, dolor de cabeza intenso, rigidez en el cuello, dolor en el pecho, problemas respiratorios o dificultad   para tragar.  Comienza a sentir dolor de garganta ms intenso junto con babeo o cambios en la voz.  Siente un dolor intenso,  que no se alivia con los medicamentos que le han recomendado.  No puede abrir completamente la boca.  Siente un dolor intenso, hinchazn o enrojecimiento en el cuello.  Tiene fiebre. ASEGRESE DE QUE:   Comprende estas instrucciones.  Controlar su afeccin.  Recibir ayuda de inmediato si no mejora o si empeora.   Esta informacin no tiene como fin reemplazar el consejo del mdico. Asegrese de hacerle al mdico cualquier pregunta que tenga.   Document Released: 07/05/2005 Document Revised: 09/30/2013 Elsevier Interactive Patient Education 2016 Elsevier Inc.  

## 2015-12-19 NOTE — Progress Notes (Signed)
   Subjective:    Patient ID: Shelley Chen, female    DOB: 02/04/1991, 25 y.o.   MRN: [id number] By signing my name below, I, Littie Deedsichard Sun, attest that this documentation has been prepared under the direction and in the presence of Elvina SidleKurt Jermya Dowding, MD.  Electronically Signed: Littie Deedsichard Sun, Medical Scribe. 12/19/2015. 4:42 PM.  HPI HPI Comments: Shelley Chen is a 25 y.o. female who presents to the [facility] complaining of gradual onset sore throat that started yesterday afternoon. The pain is worse when swallowing. Patient reports having associated congestion and back pain as well as fever with sweats last night. She has tried ibuprofen. Her 27 year old daughter is also ill with similar symptoms and is being treated with antibiotics currently. She took some of her daughter's penicillin yesterday.   Review of Systems  Constitutional: Positive for fever and diaphoresis.  HENT: Positive for congestion and sore throat.   Musculoskeletal: Positive for back pain.       Objective:   Physical Exam CONSTITUTIONAL: Well developed/well nourished. Patient appears pale. HEAD: Normocephalic/atraumatic EYES: EOM/PERRL ENMT: Mucous membranes moist. Both tonsils markedly swollen but do not obstruct the airway. They are coated with exudates and have several bleeding points. NECK: supple no meningeal signs. No swollen glands. SPINE: entire spine nontender CV: S1/S2 noted, no murmurs/rubs/gallops noted LUNGS: Lungs are clear to auscultation bilaterally, no apparent distress ABDOMEN: soft, nontender, no rebound or guarding GU: no cva tenderness NEURO: Pt is awake/alert, moves all extremitiesx4 EXTREMITIES: pulses normal, full ROM SKIN: warm, color normal. No rash. PSYCH: no abnormalities of mood noted     Assessment & Plan:   This chart was scribed in my presence and reviewed by me personally.    ICD-9-CM ICD-10-CM   1. Acute tonsillitis, unspecified etiology 463  J03.90 amoxicillin-clavulanate (AUGMENTIN) 875-125 MG tablet     magic mouthwash w/lidocaine SOLN     Signed, Elvina SidleKurt Tremon Sainvil, MD

## 2018-08-08 ENCOUNTER — Encounter: Payer: Self-pay | Admitting: Pediatric Intensive Care

## 2018-08-08 NOTE — Congregational Nurse Program (Signed)
Via interpreter Kathie Rhodes- client states she has a "lump in left breast" and her hair has been "falling out."  She states she noticed the lump about a week ago. It has been a little red but there is no drainage or skin breakdown. She states that the area is painful sometimes. Client states no history of breast cancer-personal or family- but states she had a mammmogram at age 27 for "cysts" in left breast which resolved. Client has 5cm long lump which extends from side of left breast to nipple at 9o'clock. The area is slightly discolored. The lump dimples with palpation. There is no other dimpling noted. CN made appointmentt for client at [facility]. Clinic and appointment information given to client. Client will return to [facility] clinic to work on [employer].

## 2018-08-13 ENCOUNTER — Encounter: Payer: Self-pay | Admitting: Family Medicine

## 2018-08-13 ENCOUNTER — Ambulatory Visit (INDEPENDENT_AMBULATORY_CARE_PROVIDER_SITE_OTHER): Payer: Self-pay | Admitting: Family Medicine

## 2018-08-13 VITALS — BP 97/64 | HR 67 | Temp 97.9°F | Resp 17 | Ht 66.0 in | Wt 150.2 lb

## 2018-08-13 DIAGNOSIS — N6320 Unspecified lump in the left breast, unspecified quadrant: Secondary | ICD-10-CM

## 2018-08-13 DIAGNOSIS — Z130 Encounter for screening for diseases of the blood and blood-forming organs and certain disorders involving the immune mechanism: Secondary | ICD-10-CM

## 2018-08-13 DIAGNOSIS — L659 Nonscarring hair loss, unspecified: Secondary | ICD-10-CM

## 2018-08-13 DIAGNOSIS — Z131 Encounter for screening for diabetes mellitus: Secondary | ICD-10-CM

## 2018-08-13 DIAGNOSIS — Z1389 Encounter for screening for other disorder: Secondary | ICD-10-CM

## 2018-08-13 NOTE — Patient Instructions (Addendum)
Thank you for choosing Primary Care at [facility] for your medical home!    Shelley Chen was seen by Joaquin Courts, FNP today.   [place] primary care doctor is Bing Neighbors, FNP.   For the best care possible,  you should try to see Joaquin Courts, FNP-C  whenever you come to clinic.   We look forward to seeing you again soon!  If you have any questions about your visit today,  please call us at [phone]  Or feel free to reach your provider via MyChart.      Mammogram A mammogram is an X-ray of the breasts that is done to check for changes that are not normal. This test can screen for and find any changes that may suggest breast cancer. This test can also help to find other changes and variations in the breast. What happens before the procedure?  Have this test done about 1-2 weeks after your period. This is usually when your breasts are the least tender.  If you are visiting a new doctor or clinic, send any past mammogram images to your new doctor's office.  Wash your breasts and under your arms the day of the test.  Do not use deodorants, perfumes, lotions, or powders on the day of the test.  Take off any jewelry from your neck.  Wear clothes that you can change into and out of easily. What happens during the procedure?  You will undress from the waist up. You will put on a gown.  You will stand in front of the X-ray machine.  Each breast will be placed between two plastic or glass plates. The plates will press down on your breast for a few seconds. Try to stay as relaxed as possible. This does not cause any harm to your breasts. Any discomfort you feel will be very brief.  X-rays will be taken from different angles of each breast. The procedure may vary among doctors and hospitals. What happens after the procedure?  The mammogram will be looked at by a specialist (radiologist).  You may need to do certain parts of the test  again. This depends on the quality of the images.  Ask when your test results will be ready. Make sure you get your test results.  You may go back to your normal activities. This information is not intended to replace advice given to you by your health care provider. Make sure you discuss any questions you have with your health care provider. Document Released: 12/22/2008 Document Revised: 03/02/2016 Document Reviewed: 12/04/2014 Elsevier Interactive Patient Education  2018 Elsevier Inc.     Alopecia Areata, Adult Alopecia areata is a condition that causes you to lose hair. You may lose hair on your scalp in patches. In some cases, you may lose all the hair on your scalp (alopecia totalis) or all the hair from your face and body (alopecia universalis). Alopecia areata is an autoimmune disease. This means that your body's defense system (immune system) mistakes normal parts of the body for germs or other things that can make you sick. When you have alopecia areata, the immune system attacks the hair follicles. Alopecia areata usually develops in childhood, but it can develop at any age. For some people, their hair grows back on its own and hair loss does not happen again. For others, their hair may fall out and grow back in cycles. The hair loss may last many years. Having this condition can be emotionally difficult, but it is  not dangerous. What are the causes? The cause of this condition is not known. What increases the risk? This condition is more likely to develop in people who have:  A family history of alopecia.  A family history of another autoimmune disease, including type 1 diabetes and rheumatoid arthritis.  Asthma and allergies.  Down syndrome.  What are the signs or symptoms? Round spots of patchy hair loss on the scalp is the main symptom of this condition. The spots may be mildly itchy. Other symptoms include:  Short dark hairs in the bald patches that are wider at the  top (exclamation point hairs).  Dents, white spots, or lines in the fingernails or toenails.  Balding and body hair loss. This is rare.  How is this diagnosed? This condition is diagnosed based on your symptoms and family history. Your health care provider will also check your scalp skin, teeth, and nails. Your health care provider may refer you to a specialist in hair and skin disorders (dermatologist). You may also have tests, including:  A hair pull test.  Blood tests or other screening tests to check for autoimmune diseases, such as thyroid disease or diabetes.  Skin biopsy to confirm the diagnosis.  A procedure to examine the skin with a lighted magnifying instrument (dermoscopy).  How is this treated? There is no cure for alopecia areata. Treatment is aimed at promoting the regrowth of hair and preventing the immune system from overreacting. No single treatment is right for all people with alopecia areata. It depends on the type of hair loss you have and how severe it is. Work with your health care provider to find the best treatment for you. Treatment may include:  Having regular checkups to make sure the condition is not getting worse (watchful waiting).  Steroid creams or pills for 6-8 weeks to stop the immune reaction and help hair to regrow more quickly.  Other topical medicines to alter the immune system response and support the hair growth cycle.  Steroid injections.  Therapy and counseling with a support group or therapist if you are having trouble coping with hair loss.  Follow these instructions at home:  Learn as much as you can about your condition.  Apply topical creams only as told by your health care provider.  Take over-the-counter and prescription medicines only as told by your health care provider.  Consider getting a wig or products to make hair look fuller or to cover bald spots, if you feel uncomfortable with your appearance.  Get therapy or  counseling if you are having a hard time coping with hair loss. Ask your health care provider to recommend a counselor or support group.  Keep all follow-up visits as told by your health care provider. This is important. Contact a health care provider if:  Your hair loss gets worse, even with treatment.  You have new symptoms.  You are struggling emotionally. Summary  Alopecia areata is an autoimmune condition that makes your body's defense system (immune system) attack the hair follicles. This causes you to lose hair.  Treatments may include regular checkups to make sure that the condition is not getting worse (watchful waiting), medicines, and steroid injections. This information is not intended to replace advice given to you by your health care provider. Make sure you discuss any questions you have with your health care provider. Document Released: 04/29/2004 Document Revised: 10/13/2016 Document Reviewed: 10/13/2016 Elsevier Interactive Patient Education  2018 [employer].

## 2018-08-13 NOTE — Progress Notes (Signed)
Shelley Chen, is a 27 y.o. female  ZOX:[id number]  [id number]  DOB - January 17, 1991  CC:  Chief Complaint  Patient presents with  . Establish Care  . Breast Mass    L breast lump x 2 weeks. states that it has gotten smaller in size since she first noticed it. lump is painful when she palpates it  . Alopecia    generalized hair loss x 2 months       HPI: Shelley Chen is a 27 y.o. female is here today to establish care.   [name] has S/P cesarean section and BTS, lysis of adhesions (3)-C-sections. Tubal ligation  2014.  Today's visit:   Hair loss  Noticed several months ago hair shedding with combing. She reports now hair falling out by the handfuls. She has no areas of balding that she has noticed on her head. Denies family history of female pattern baldness. No history of thyroid problems. No recent changes in hair care products, no recent stressors, and she doesn't wear hair extensions.   Breast Mass Pain in left breast x 2 weeks. Pain is not constant and is mostly associated with movement. She noticed a mass that approximately the size of a quarter on her breast x 2 weeks ago. The area non-redden and not really tender with palpation. She has a history at age 70 she was informed of two nodule of breast which were benign. No family history of breast cancer. Currently not breast feeing. She has aken Advil and warm compresses without improvement of pain.  Patient denies new headaches, chest pain, abdominal pain, nausea, new weakness , numbness or tingling, SOB, edema, or worrisome cough.   Current medications:No current outpatient medications on file.   Pertinent family medical history: family history is not on file.   No Known Allergies  Social History   Socioeconomic History  . Marital status: Single    Spouse name: Not on file  . Number of children: Not on file  . Years of education: Not on file  . Highest education level: Not on file  Occupational History   . Not on file  Social Needs  . Financial resource strain: Not on file  . Food insecurity:    Worry: Not on file    Inability: Not on file  . Transportation needs:    Medical: Not on file    Non-medical: Not on file  Tobacco Use  . Smoking status: Never Smoker  . Smokeless tobacco: Never Used  Substance and Sexual Activity  . Alcohol use: No  . Drug use: No  . Sexual activity: Yes    Birth control/protection: Surgical  Lifestyle  . Physical activity:    Days per week: Not on file    Minutes per session: Not on file  . Stress: Not on file  Relationships  . Social connections:    Talks on phone: Not on file    Gets together: Not on file    Attends religious service: Not on file    Active member of club or organization: Not on file    Attends meetings of clubs or organizations: Not on file    Relationship status: Not on file  . Intimate partner violence:    Fear of current or ex partner: Not on file    Emotionally abused: Not on file    Physically abused: Not on file    Forced sexual activity: Not on file  Other Topics Concern  . Not on file  Social History Narrative  .  Not on file    Review of Systems: Constitutional: Negative for fever, chills, diaphoresis, activity change, appetite change and fatigue. Respiratory: Negative for cough, choking, chest tightness, shortness of breath, wheezing and stridor.  Cardiovascular: Negative for chest pain, palpitations and leg swelling. Gastrointestinal: Negative for abdominal distention. Musculoskeletal: Negative for back pain, joint swelling, arthralgia and gait problem. Neurological: Negative for dizziness, tremors, seizures, syncope, facial asymmetry, speech difficulty, weakness, light-headedness, numbness and headaches.  Psychiatric/Behavioral: Negative for hallucinations, behavioral problems, confusion, dysphoric mood, decreased concentration and agitation.  Objective:   Vitals:   08/13/18 0931  BP: 97/64  Pulse: 67   Resp: 17  Temp: 97.9 F (36.6 C)  SpO2: 99%    BP Readings from Last 3 Encounters:  08/13/18 97/64  12/19/15 102/62  12/12/15 116/70    Filed Weights   08/13/18 0931  Weight: 150 lb 3.2 oz (68.1 kg)      Physical Exam: Constitutional: Patient appears well-developed and well-nourished. No distress. HENT: Normocephalic, atraumatic, External right and left ear normal. Oropharynx is clear and moist.  Eyes: Conjunctivae and EOM are normal. PERRLA, no scleral icterus. Neck: Normal ROM. Neck supple. No JVD. No tracheal deviation. No thyromegaly. CVS: RRR, S1/S2 +, no murmurs, no gallops, no carotid bruit.  Pulmonary: Effort and breath sounds normal, no stridor, rhonchi, wheezes, rales.  Chest Wall: Left breast mass noted at the 8-9 o'clock region immobile and hardened Abdominal: Soft. BS +, no distension, tenderness, rebound or guarding.  Musculoskeletal: Normal range of motion. No edema and no tenderness.  Neuro: Alert. Normal muscle tone coordination. Normal gait. BUE and BLE strength 5/5.  Bilateral hand grips symmetrical. No cranial nerve deficit. Skin: Skin is warm and dry. No rash noted. Not diaphoretic. No erythema. No pallor. Psychiatric: Normal mood and affect. Behavior, judgment, thought content normal. Lab Results (prior encounters)  Lab Results  Component Value Date   WBC 9.2 12/12/2015   HGB 12.0 (A) 12/12/2015   HCT 35.8 (A) 12/12/2015   MCV 80.0 12/12/2015   PLT 170 05/01/2014   Assessment and plan:  1. Breast mass, left - US BREAST COMPLETE UNI LEFT INC AXILLA; Future  2. Screening for diabetes mellitus - Comprehensive metabolic panel - Lipid panel - Hemoglobin A1c  3. Hair loss,  - Thyroid Panel With TSH - Testosterone -Will refer to dermatology if labs are normal for further evaluation.  4. Screening for deficiency anemia - CBC with Differential  5. Screening for blood or protein in urine - Urinalysis  A total of 30 minutes spent, greater than  50 % of this time was spent counseling and coordination of care.   Return in about 1 month (around 09/12/2018), or PAP .  The patient was given clear instructions to go to ER or return to medical center if symptoms don't improve, worsen or new problems develop. The patient verbalized understanding. The patient was advised  to call and obtain lab results if they haven't heard anything from out office within 7-10 business days.  Joaquin Courts, FNP Primary Care at [facility] [address] [place] [address] [phone]: [phone]    This note has been created with Dragon speech recognition software and Paediatric nurse. Any transcriptional errors are unintentional.

## 2018-08-14 ENCOUNTER — Telehealth: Payer: Self-pay

## 2018-08-14 LAB — COMPREHENSIVE METABOLIC PANEL
A/G RATIO: 1.5 (ref 1.2–2.2)
ALT: 9 IU/L (ref 0–32)
AST: 12 IU/L (ref 0–40)
Albumin: 4.5 g/dL (ref 3.5–5.5)
Alkaline Phosphatase: 59 IU/L (ref 39–117)
BUN/Creatinine Ratio: 15 (ref 9–23)
BUN: 10 mg/dL (ref 6–20)
Bilirubin Total: <0.2 mg/dL (ref 0.0–1.2)
CO2: 20 mmol/L (ref 20–29)
Calcium: 9.2 mg/dL (ref 8.7–10.2)
Chloride: 106 mmol/L (ref 96–106)
Creatinine, Ser: 0.65 mg/dL (ref 0.57–1.00)
GFR calc Af Amer: 139 mL/min/{1.73_m2} (ref >59)
GFR calc non Af Amer: 120 mL/min/{1.73_m2} (ref >59)
Globulin, Total: 3.1 g/dL (ref 1.5–4.5)
Glucose: 91 mg/dL (ref 65–99)
POTASSIUM: 4.5 mmol/L (ref 3.5–5.2)
Sodium: 141 mmol/L (ref 134–144)
Total Protein: 7.6 g/dL (ref 6.0–8.5)

## 2018-08-14 LAB — CBC WITH DIFFERENTIAL/PLATELET
BASOS ABS: 0.1 10*3/uL (ref 0.0–0.2)
Basos: 1 %
EOS (ABSOLUTE): 0.3 10*3/uL (ref 0.0–0.4)
Eos: 6 %
Hematocrit: 33.4 % — ABNORMAL LOW (ref 34.0–46.6)
Hemoglobin: 10.4 g/dL — ABNORMAL LOW (ref 11.1–15.9)
IMMATURE GRANULOCYTES: 0 %
Immature Grans (Abs): 0 10*3/uL (ref 0.0–0.1)
Lymphocytes Absolute: 1.8 10*3/uL (ref 0.7–3.1)
Lymphs: 29 %
MCH: 23.9 pg — ABNORMAL LOW (ref 26.6–33.0)
MCHC: 31.1 g/dL — ABNORMAL LOW (ref 31.5–35.7)
MCV: 77 fL — AB (ref 79–97)
MONOS ABS: 0.4 10*3/uL (ref 0.1–0.9)
Monocytes: 6 %
NEUTROS PCT: 58 %
Neutrophils Absolute: 3.6 10*3/uL (ref 1.4–7.0)
PLATELETS: 261 10*3/uL (ref 150–450)
RBC: 4.36 x10E6/uL (ref 3.77–5.28)
RDW: 14.4 % (ref 12.3–15.4)
WBC: 6.2 10*3/uL (ref 3.4–10.8)

## 2018-08-14 LAB — TESTOSTERONE: TESTOSTERONE: 11 ng/dL (ref 8–48)

## 2018-08-14 LAB — HEMOGLOBIN A1C
Est. average glucose Bld gHb Est-mCnc: 103 mg/dL
Hgb A1c MFr Bld: 5.2 % (ref 4.8–5.6)

## 2018-08-14 LAB — THYROID PANEL WITH TSH
FREE THYROXINE INDEX: 1.9 (ref 1.2–4.9)
T3 UPTAKE RATIO: 24 % (ref 24–39)
T4, Total: 8 ug/dL (ref 4.5–12.0)
TSH: 1.97 u[IU]/mL (ref 0.450–4.500)

## 2018-08-14 LAB — LIPID PANEL
CHOL/HDL RATIO: 6.2 ratio — AB (ref 0.0–4.4)
Cholesterol, Total: 186 mg/dL (ref 100–199)
HDL: 30 mg/dL — ABNORMAL LOW (ref >39)
LDL CALC: 124 mg/dL — AB (ref 0–99)
TRIGLYCERIDES: 158 mg/dL — AB (ref 0–149)
VLDL CHOLESTEROL CAL: 32 mg/dL (ref 5–40)

## 2018-08-14 NOTE — Telephone Encounter (Signed)
Called patient to explain this to her. She requested a call back from an interpreter to explain.

## 2018-08-14 NOTE — Telephone Encounter (Signed)
[facility] to [facility]. Was told that she needed to turn in the breast scholarship paperwork prior to her being scheduled.

## 2018-08-15 NOTE — Addendum Note (Signed)
Addended by: Bing Neighbors on: 08/15/2018 10:12 PM   Modules accepted: Orders

## 2018-08-17 LAB — IRON,TIBC AND FERRITIN PANEL
Ferritin: 5 ng/mL — ABNORMAL LOW (ref 15–150)
IRON SATURATION: 5 % — AB (ref 15–55)
Iron: 17 ug/dL — ABNORMAL LOW (ref 27–159)
Total Iron Binding Capacity: 352 ug/dL (ref 250–450)
UIBC: 335 ug/dL (ref 131–425)

## 2018-08-17 LAB — SPECIMEN STATUS REPORT

## 2018-08-19 ENCOUNTER — Telehealth: Payer: Self-pay | Admitting: *Deleted

## 2018-08-19 NOTE — Telephone Encounter (Signed)
Medical Assistant used [employer] to contact patient.  Interpreter Name: Roselyn Meier #: [id number] Patient was not available, [employer] left patient a voicemail. Voicemail states to give a call back to [place] with [employer] at [phone]. Patient is aware of results and needing infusion on Thursday at 9:00a and a recheck in 2 weeks and cholesterol in 6 months.

## 2018-08-19 NOTE — Telephone Encounter (Signed)
-----   Message from Bing Neighbors, FNP sent at 08/19/2018 10:09 AM EST ----- Contact patient to advise hemoglobin was low and iron is very low. I would like to schedule her for an iron infusion on Thursday at 9:00 am which will hopefully improve her hemoglobin and iron studies.  I would like for her to return here in 2 weeks for repeat iron and hemoglobin labs Also lipid panel was abnormal. Uncertain if she was fasting or not, however bad cholesterol was elevated and triglycerides were elevated. Encourage reduce fat and fried food intake, make efforts to exercise 150 minutes per week. Will repeat lipid panel in 6 months

## 2018-08-23 NOTE — Progress Notes (Signed)
Patient notified of results & recommendations. Expressed understanding.

## 2018-08-26 ENCOUNTER — Telehealth: Payer: Self-pay

## 2018-08-26 NOTE — Telephone Encounter (Signed)
Patient is scheduled for 11/21 at 9:00 am at the [facility] to receive one infusion of iron. I would like to her to return here in 4 week for CBC and iron level check.  The address for the [facility] Center  [address]  [place],  [place]  [id number] [phone]

## 2018-08-26 NOTE — Telephone Encounter (Signed)
Patient stopped by the office to get the address of where to have her iron infusion done. Do you have a contact so that I can schedule this?

## 2018-08-27 ENCOUNTER — Other Ambulatory Visit: Payer: Self-pay | Admitting: Family Medicine

## 2018-08-27 DIAGNOSIS — D509 Iron deficiency anemia, unspecified: Secondary | ICD-10-CM

## 2018-08-27 NOTE — Telephone Encounter (Signed)
Patient notified of appt date & time. Made lab appt for 09/27/18.

## 2018-08-27 NOTE — Progress Notes (Signed)
Orders placed for iron infusion at patient care center [facility]

## 2018-08-29 ENCOUNTER — Ambulatory Visit (HOSPITAL_COMMUNITY)
Admission: RE | Admit: 2018-08-29 | Discharge: 2018-08-29 | Disposition: A | Discharge status: Home / Self Care | Payer: Self-pay | Source: Ambulatory Visit | Attending: Family Medicine | Admitting: Family Medicine

## 2018-08-29 DIAGNOSIS — D509 Iron deficiency anemia, unspecified: Secondary | ICD-10-CM | POA: Insufficient documentation

## 2018-08-29 MED ORDER — SODIUM CHLORIDE 0.9 % IV SOLN
INTRAVENOUS | Status: DC | PRN
  Administered 2018-08-29: 250 mL via INTRAVENOUS

## 2018-08-29 MED ORDER — SODIUM CHLORIDE 0.9 % IV SOLN
510.0000 mg | Freq: Once | INTRAVENOUS | Status: AC
  Administered 2018-08-29: 510 mg via INTRAVENOUS
  Filled 2018-08-29: qty 17

## 2018-08-29 NOTE — Discharge Instructions (Signed)

## 2018-08-29 NOTE — Progress Notes (Signed)
PATIENT CARE CENTER NOTE  Diagnosis: Iron Deficiency Anemia    Provider: Joaquin CourtsKimberly Harris, FNP   Procedure: IV Feraheme    Note: Patient received Feraheme infusion. Tolerated infusion well with no adverse reaction. Monitored for 30 minutes post-infusion. Vital signs stable. Discharge instructions given. Patient alert, oriented and ambulatory at discharge.

## 2018-09-12 ENCOUNTER — Encounter: Payer: Self-pay | Admitting: Family Medicine

## 2018-09-12 ENCOUNTER — Other Ambulatory Visit (HOSPITAL_COMMUNITY)
Admission: RE | Admit: 2018-09-12 | Discharge: 2018-09-12 | Disposition: A | Discharge status: Home / Self Care | Payer: Self-pay | Source: Ambulatory Visit | Attending: Family Medicine | Admitting: Family Medicine

## 2018-09-12 ENCOUNTER — Ambulatory Visit (INDEPENDENT_AMBULATORY_CARE_PROVIDER_SITE_OTHER): Payer: Self-pay | Admitting: Family Medicine

## 2018-09-12 VITALS — BP 98/63 | HR 88 | Resp 17 | Ht 66.0 in | Wt 150.4 lb

## 2018-09-12 DIAGNOSIS — Z124 Encounter for screening for malignant neoplasm of cervix: Secondary | ICD-10-CM

## 2018-09-12 DIAGNOSIS — R399 Unspecified symptoms and signs involving the genitourinary system: Secondary | ICD-10-CM

## 2018-09-12 DIAGNOSIS — N760 Acute vaginitis: Secondary | ICD-10-CM

## 2018-09-12 DIAGNOSIS — D509 Iron deficiency anemia, unspecified: Secondary | ICD-10-CM

## 2018-09-12 LAB — POCT URINALYSIS DIP (CLINITEK)
Bilirubin, UA: NEGATIVE
Glucose, UA: NEGATIVE mg/dL
Nitrite, UA: NEGATIVE
Spec Grav, UA: 1.02 (ref 1.010–1.025)
Urobilinogen, UA: 1 E.U./dL
pH, UA: 8 (ref 5.0–8.0)

## 2018-09-12 MED ORDER — FLUCONAZOLE 150 MG PO TABS: 150.0000 mg | ORAL_TABLET | Freq: Once | ORAL | 0 refills | Status: AC

## 2018-09-12 MED ORDER — CLOTRIMAZOLE-BETAMETHASONE 1-0.05 % EX CREA: 1.0000 "application " | TOPICAL_CREAM | Freq: Two times a day (BID) | CUTANEOUS | 0 refills | Status: DC

## 2018-09-12 NOTE — Patient Instructions (Signed)
Pap Test Why am I having this test? A pap test is sometimes called a pap smear. It is a screening test that is used to check for signs of cancer of the vagina, cervix, and uterus. The test can also identify the presence of infection or precancerous changes. Your health care provider will likely recommend you have this test done on a regular basis. This test may be done:  Every 3 years, starting at age 721.  Every 5 years, in combination with testing for the presence of human papillomavirus (HPV).  More or less often depending on other medical conditions.  What kind of sample is taken? Using a small cotton swab, plastic spatula, or brush, your health care provider will collect a sample of cells from the surface of your cervix. Your cervix is the opening to your uterus, also called a womb. Secretions from the cervix and vagina may also be collected. How do I prepare for this test?  Be aware of where you are in your menstrual cycle. You may be asked to reschedule the test if you are menstruating on the day of the test.  You may need to reschedule if you have a known vaginal infection on the day of the test.  You may be asked to avoid douching or taking a bath the day before or the day of the test.  Some medicines can cause abnormal test results, such as digitalis and tetracycline. Talk with your health care provider before your test if you take one of these medicines. What do the results mean? Abnormal test results may indicate a number of health conditions. These may include:  Cancer. Although pap test results cannot be used to diagnose cancer of the cervix, vagina, or uterus, they may suggest the possibility of cancer. Further tests would be required to determine if cancer is present.  Sexually transmitted disease.  Fungal infection.  Parasite infection.  Herpes infection.  A condition causing or contributing to infertility.  It is your responsibility to obtain your test results.  Ask the lab or department performing the test when and how you will get your results. Contact your health care provider to discuss any questions you have about your results. Talk with your health care provider to discuss your results, treatment options, and if necessary, the need for more tests. Talk with your health care provider if you have any questions about your results. This information is not intended to replace advice given to you by your health care provider. Make sure you discuss any questions you have with your health care provider. Document Released: 12/16/2002 Document Revised: 05/31/2016 Document Reviewed: 02/16/2014 Elsevier Interactive Patient Education  2018 [employer]      Vaginitis Vaginitis is an inflammation of the vagina. It can happen when the normal bacteria and yeast in the vagina grow too much. There are different types. Treatment will depend on the type you have. Follow these instructions at home:  Take all medicines as told by your doctor.  Keep your vagina area clean and dry. Avoid soap. Rinse the area with water.  Avoid washing and cleaning out the vagina (douching).  Do not use tampons or have sex (intercourse) until your treatment is done.  Wipe from front to back after going to the restroom.  Wear cotton underwear.  Avoid wearing underwear while you sleep until your vaginitis is gone.  Avoid tight pants. Avoid underwear or nylons without a cotton panel.  Take off wet clothing (such as a bathing suit) as  soon as you can.  Use mild, unscented products. Avoid fabric softeners and scented: ? Feminine sprays. ? Laundry detergents. ? Tampons. ? Soaps or bubble baths.  Practice safe sex and use condoms. Get help right away if:  You have belly (abdominal) pain.  You have a fever or lasting symptoms for more than 2-3 days.  You have a fever and your symptoms suddenly get worse. This information is not intended to replace advice given to you by  your health care provider. Make sure you discuss any questions you have with your health care provider. Document Released: 12/22/2008 Document Revised: 03/02/2016 Document Reviewed: 03/07/2012 Elsevier Interactive Patient Education  2017 [employer].

## 2018-09-13 LAB — IRON,TIBC AND FERRITIN PANEL
Ferritin: 160 ng/mL — ABNORMAL HIGH (ref 15–150)
Iron Saturation: 22 % (ref 15–55)
Iron: 62 ug/dL (ref 27–159)
Total Iron Binding Capacity: 288 ug/dL (ref 250–450)
UIBC: 226 ug/dL (ref 131–425)

## 2018-09-13 LAB — CBC WITH DIFFERENTIAL/PLATELET
Basophils Absolute: 0.1 10*3/uL (ref 0.0–0.2)
Basos: 1 %
EOS (ABSOLUTE): 0.2 10*3/uL (ref 0.0–0.4)
EOS: 3 %
Hematocrit: 36.9 % (ref 34.0–46.6)
Hemoglobin: 11.4 g/dL (ref 11.1–15.9)
Immature Grans (Abs): 0 10*3/uL (ref 0.0–0.1)
Immature Granulocytes: 0 %
Lymphocytes Absolute: 2 10*3/uL (ref 0.7–3.1)
Lymphs: 24 %
MCH: 25.1 pg — AB (ref 26.6–33.0)
MCHC: 30.9 g/dL — ABNORMAL LOW (ref 31.5–35.7)
MCV: 81 fL (ref 79–97)
Monocytes Absolute: 0.5 10*3/uL (ref 0.1–0.9)
Monocytes: 6 %
NEUTROS ABS: 5.4 10*3/uL (ref 1.4–7.0)
Neutrophils: 66 %
Platelets: 227 10*3/uL (ref 150–450)
RBC: 4.54 x10E6/uL (ref 3.77–5.28)
RDW: 16.6 % — ABNORMAL HIGH (ref 12.3–15.4)
WBC: 8.2 10*3/uL (ref 3.4–10.8)

## 2018-09-13 NOTE — Progress Notes (Signed)
Hemoglobin and iron are within normal range since you received iron transfusion. Will recheck in iron and hemoglobin in 6 months.

## 2018-09-14 LAB — URINE CULTURE

## 2018-09-17 LAB — CYTOLOGY - PAP
Candida vaginitis: NEGATIVE
Chlamydia: NEGATIVE
Diagnosis: NEGATIVE
NEISSERIA GONORRHEA: NEGATIVE
TRICH (WINDOWPATH): NEGATIVE

## 2018-09-19 NOTE — Progress Notes (Signed)
Established Patient Office Visit  Subjective:  Patient ID: Shelley Chen, female    DOB: 03/17/1991  Age: 27 y.o. MRN: [id number]  CC:  Chief Complaint  Patient presents with  . Gynecologic Exam    HPI Shelley Chen presents for PAP only today. Complains of dysuria and vaginal irritation occurring over the last few days.  She has been negative for fever, chills, CVA tenderness.  No new sexual contacts.  No history of chronic UTIs.  Past Medical History:  Diagnosis Date  . No pertinent past medical history     Past Surgical History:  Procedure Laterality Date  . CESAREAN SECTION     C/S x 2  . CESAREAN SECTION WITH BILATERAL TUBAL LIGATION Bilateral 04/30/2014   Procedure: REPEAT CESAREAN SECTION WITH BILATERAL TUBAL LIGATION;  Surgeon: Tereso NewcomerUgonna A Anyanwu, MD;  Location: [facility];  Service: Obstetrics;  Laterality: Bilateral;  . DILATION AND EVACUATION  11/12/2012   Procedure: DILATATION AND EVACUATION;  Surgeon: Reva Boresanya S Pratt, MD;  Location: [facility];  Service: Gynecology;  Laterality: N/A;  . LYSIS OF ADHESION N/A 04/30/2014   Procedure: LYSIS OF ADHESION;  Surgeon: Tereso NewcomerUgonna A Anyanwu, MD;  Location: [facility];  Service: Obstetrics;  Laterality: N/A;    No family history on file.  Social History   Socioeconomic History  . Marital status: Single    Spouse name: Not on file  . Number of children: Not on file  . Years of education: Not on file  . Highest education level: Not on file  Occupational History  . Not on file  Social Needs  . Financial resource strain: Not on file  . Food insecurity:    Worry: Not on file    Inability: Not on file  . Transportation needs:    Medical: Not on file    Non-medical: Not on file  Tobacco Use  . Smoking status: Never Smoker  . Smokeless tobacco: Never Used  Substance and Sexual Activity  . Alcohol use: No  . Drug use: No  . Sexual activity: Yes    Birth control/protection: Surgical  Lifestyle  . Physical activity:     Days per week: Not on file    Minutes per session: Not on file  . Stress: Not on file  Relationships  . Social connections:    Talks on phone: Not on file    Gets together: Not on file    Attends religious service: Not on file    Active member of club or organization: Not on file    Attends meetings of clubs or organizations: Not on file    Relationship status: Not on file  . Intimate partner violence:    Fear of current or ex partner: Not on file    Emotionally abused: Not on file    Physically abused: Not on file    Forced sexual activity: Not on file  Other Topics Concern  . Not on file  Social History Narrative  . Not on file    No outpatient medications prior to visit.   No facility-administered medications prior to visit.     No Known Allergies  ROS Review of Systems    Objective:    Physical Exam  BP 98/63   Pulse 88   Resp 17   Ht 5\' 6"  (1.676 m)   Wt 150 lb 6.4 oz (68.2 kg)   LMP  (LMP Unknown)   SpO2 95%   BMI 24.28 kg/m  Wt Readings from Last 3  Encounters:  09/12/18 150 lb 6.4 oz (68.2 kg)  08/13/18 150 lb 3.2 oz (68.1 kg)  12/19/15 150 lb (68 kg)    General appearance: alert, well developed, well nourished, cooperative and in no distress Head: Normocephalic, without obvious abnormality, atraumatic Respiratory: Respirations even and unlabored, normal respiratory rate Extremities: No gross deformities Genitourinary; Normal female external genitalia without lesion. No inguinal lymphadenopathy. Vaginal mucosa is pink.White thick discharge present.  Labia minora irritation and erythema present. Pap smear obtained. No cervical motion tenderness, adnexal fullness or tenderness. Skin: Skin color, texture, turgor normal. No rashes seen  Psych: Appropriate mood and affect. Neurologic: Mental status: Alert, oriented to person, place, and time, thought content appropriate.  Lab Results  Component Value Date   TSH 1.970 08/13/2018   Lab Results   Component Value Date   WBC 8.2 09/12/2018   HGB 11.4 09/12/2018   HCT 36.9 09/12/2018   MCV 81 09/12/2018   PLT 227 09/12/2018   Lab Results  Component Value Date   NA 141 08/13/2018   K 4.5 08/13/2018   CO2 20 08/13/2018   GLUCOSE 91 08/13/2018   BUN 10 08/13/2018   CREATININE 0.65 08/13/2018   BILITOT <0.2 08/13/2018   ALKPHOS 59 08/13/2018   AST 12 08/13/2018   ALT 9 08/13/2018   PROT 7.6 08/13/2018   ALBUMIN 4.5 08/13/2018   CALCIUM 9.2 08/13/2018   Lab Results  Component Value Date   CHOL 186 08/13/2018   Lab Results  Component Value Date   HDL 30 (L) 08/13/2018   Lab Results  Component Value Date   LDLCALC 124 (H) 08/13/2018   Lab Results  Component Value Date   TRIG 158 (H) 08/13/2018   Lab Results  Component Value Date   CHOLHDL 6.2 (H) 08/13/2018   Lab Results  Component Value Date   HGBA1C 5.2 08/13/2018     Assessment & Plan:   Problem List Items Addressed This Visit    None    Visit Diagnoses    Cervical cancer screening    -  Primary   Relevant Orders   Cytology - PAP([place]) (Completed)   UTI symptoms   , UA negative. Culture pending    Relevant Orders   POCT URINALYSIS DIP (CLINITEK) (Completed)   Urine Culture (Completed)   Vaginitis and vulvovaginitis , will trial Clotrimazole for external irritation noted during exam.      Iron deficiency anemia, unspecified iron deficiency anemia type       Relevant Orders   CBC with Differential (Completed)   Iron, TIBC and Ferritin Panel (Completed)      Meds ordered this encounter  Medications  . clotrimazole-betamethasone (LOTRISONE) cream    Sig: Apply 1 application topically 2 (two) times daily.    Dispense:  30 g    Refill:  0  . fluconazole (DIFLUCAN) 150 MG tablet    Sig: Take 1 tablet (150 mg total) by mouth once for 1 dose. Repeat if needed    Dispense:  2 tablet    Refill:  0    Follow-up: Return in about 6 months (around 03/14/2019) for iron deficiency anemia .     Joaquin Courts, FNP

## 2018-09-27 ENCOUNTER — Other Ambulatory Visit: Payer: Self-pay

## 2018-11-19 ENCOUNTER — Telehealth: Payer: Self-pay

## 2018-11-19 ENCOUNTER — Other Ambulatory Visit (HOSPITAL_COMMUNITY): Payer: Self-pay | Admitting: *Deleted

## 2018-11-19 DIAGNOSIS — N6320 Unspecified lump in the left breast, unspecified quadrant: Secondary | ICD-10-CM

## 2018-11-19 NOTE — Telephone Encounter (Signed)
Patient called in & states that she is having issues being scheduled for her breast imaging using the scholarship paperwork. She says that when she went to turn in the paperwork she was told that the scholarship isn't being accepted anymore. There is a language barrier so maybe something was lost in translation. If possible could you reach out to patient?

## 2018-12-10 ENCOUNTER — Ambulatory Visit (HOSPITAL_COMMUNITY)
Admission: RE | Admit: 2018-12-10 | Discharge: 2018-12-10 | Disposition: A | Discharge status: Home / Self Care | Payer: Self-pay | Source: Ambulatory Visit | Attending: Obstetrics and Gynecology | Admitting: Obstetrics and Gynecology

## 2018-12-10 ENCOUNTER — Encounter (HOSPITAL_COMMUNITY): Payer: Self-pay

## 2018-12-10 VITALS — BP 108/64 | Wt 144.0 lb

## 2018-12-10 DIAGNOSIS — Z1239 Encounter for other screening for malignant neoplasm of breast: Secondary | ICD-10-CM

## 2018-12-10 DIAGNOSIS — N6320 Unspecified lump in the left breast, unspecified quadrant: Secondary | ICD-10-CM

## 2018-12-10 NOTE — Progress Notes (Signed)
Patient complained of two left breast lumps x 6 months that have increased in size. Patient complained of left breast pain and redness within the left inner breast. Patient states the pain comes and goes. Patient rates the pain at a 7-8 out of 10.  Pap Smear: Pap smear not completed today. Last Pap smear was 09/12/2018 at [facility] and normal. Per patient has no history of an abnormal Pap smear. Last Pap smear result is in Epic.  Physical exam: Breasts Breasts symmetrical. No skin abnormalities right breast. Reddened area observed on left breast next to areola between 8 o'clock and 10 o'clock. No nipple retraction bilateral breasts. No nipple discharge bilateral breasts. No lymphadenopathy. No lumps palpated right breast. Palpated two lumps within the left breast. Palpated a lump between 8 o'clock and 10 o'clock next to areola and a bb sized lump at 5 o'clock 6 cm from the nipple within the left breast. Complaints of pain when palpated the left breast lump between 8 o'clock and 10 o'clock. Referred patient to the [facility] for a left breast ultrasound. Appointment scheduled for Wednesday, December 11, 2018 at 1230.        Pelvic/Bimanual No Pap smear completed today since last Pap smear was 09/12/2018. Pap smear not indicated per BCCCP guidelines.   Smoking History: Patient has never smoked.  Patient Navigation: Patient education provided. Access to services provided for patient through [facility] program. Spanish interpreter provided.   Breast and Cervical Cancer Risk Assessment: Patient has no family history of breast cancer, known genetic mutations, or radiation treatment to the chest before age 34. Patient has no history of cervical dysplasia, immunocompromised, or DES exposure in-utero. Breast cancer risk assessment completed. No breast cancer risk calculated due to patient is less than 43 years old.  Used Spanish interpreter [employer] from [place].

## 2018-12-10 NOTE — Patient Instructions (Signed)
Explained breast self awareness with Rowe Robert. Patient did not need a Pap smear today due to last Pap smear was 09/12/2018. Let her know BCCCP will cover Pap smears every 3 years unless has a history of abnormal Pap smears. Referred patient to the [facility] of [facility] for a left breast ultrasound. Appointment scheduled for Wednesday, December 11, 2018 at 1230.  Patient aware of appointment and will be there.  Emogean Boyatt verbalized understanding.  Brannock, Kathaleen Maser, RN 10:04 AM

## 2018-12-11 ENCOUNTER — Other Ambulatory Visit (HOSPITAL_COMMUNITY): Payer: Self-pay | Admitting: Obstetrics and Gynecology

## 2018-12-11 ENCOUNTER — Ambulatory Visit
Admission: RE | Admit: 2018-12-11 | Discharge: 2018-12-11 | Disposition: A | Discharge status: Home / Self Care | Payer: No Typology Code available for payment source | Source: Ambulatory Visit | Attending: Obstetrics and Gynecology | Admitting: Obstetrics and Gynecology

## 2018-12-11 DIAGNOSIS — N6320 Unspecified lump in the left breast, unspecified quadrant: Secondary | ICD-10-CM

## 2018-12-11 DIAGNOSIS — L0291 Cutaneous abscess, unspecified: Secondary | ICD-10-CM

## 2018-12-12 ENCOUNTER — Ambulatory Visit
Admission: RE | Admit: 2018-12-12 | Discharge: 2018-12-12 | Disposition: A | Discharge status: Home / Self Care | Payer: No Typology Code available for payment source | Source: Ambulatory Visit | Attending: Obstetrics and Gynecology | Admitting: Obstetrics and Gynecology

## 2018-12-12 ENCOUNTER — Other Ambulatory Visit (HOSPITAL_COMMUNITY): Payer: Self-pay | Admitting: Obstetrics and Gynecology

## 2018-12-12 ENCOUNTER — Inpatient Hospital Stay: Admission: RE | Admit: 2018-12-12 | Payer: No Typology Code available for payment source | Source: Ambulatory Visit

## 2018-12-12 DIAGNOSIS — L0291 Cutaneous abscess, unspecified: Secondary | ICD-10-CM

## 2018-12-17 LAB — AEROBIC/ANAEROBIC CULTURE W GRAM STAIN (SURGICAL/DEEP WOUND): Culture: NO GROWTH

## 2018-12-23 ENCOUNTER — Ambulatory Visit
Admission: RE | Admit: 2018-12-23 | Discharge: 2018-12-23 | Disposition: A | Discharge status: Home / Self Care | Payer: No Typology Code available for payment source | Source: Ambulatory Visit | Attending: Obstetrics and Gynecology | Admitting: Obstetrics and Gynecology

## 2018-12-23 ENCOUNTER — Other Ambulatory Visit (HOSPITAL_COMMUNITY): Payer: Self-pay | Admitting: Obstetrics and Gynecology

## 2018-12-23 ENCOUNTER — Other Ambulatory Visit: Payer: Self-pay

## 2018-12-23 DIAGNOSIS — N611 Abscess of the breast and nipple: Secondary | ICD-10-CM

## 2018-12-23 DIAGNOSIS — L0291 Cutaneous abscess, unspecified: Secondary | ICD-10-CM

## 2018-12-25 ENCOUNTER — Encounter (HOSPITAL_COMMUNITY): Payer: Self-pay | Admitting: *Deleted

## 2019-01-02 ENCOUNTER — Other Ambulatory Visit: Payer: Self-pay

## 2019-01-02 ENCOUNTER — Ambulatory Visit
Admission: RE | Admit: 2019-01-02 | Discharge: 2019-01-02 | Disposition: A | Discharge status: Home / Self Care | Payer: No Typology Code available for payment source | Source: Ambulatory Visit | Attending: Obstetrics and Gynecology | Admitting: Obstetrics and Gynecology

## 2019-01-02 ENCOUNTER — Other Ambulatory Visit (HOSPITAL_COMMUNITY): Payer: Self-pay | Admitting: Obstetrics and Gynecology

## 2019-01-02 DIAGNOSIS — N611 Abscess of the breast and nipple: Secondary | ICD-10-CM

## 2019-02-03 ENCOUNTER — Other Ambulatory Visit: Payer: Self-pay

## 2019-02-03 ENCOUNTER — Other Ambulatory Visit (HOSPITAL_COMMUNITY): Payer: Self-pay | Admitting: Obstetrics and Gynecology

## 2019-02-03 ENCOUNTER — Ambulatory Visit
Admission: RE | Admit: 2019-02-03 | Discharge: 2019-02-03 | Disposition: A | Discharge status: Home / Self Care | Payer: No Typology Code available for payment source | Source: Ambulatory Visit | Attending: Obstetrics and Gynecology | Admitting: Obstetrics and Gynecology

## 2019-02-03 DIAGNOSIS — N611 Abscess of the breast and nipple: Secondary | ICD-10-CM

## 2019-02-10 ENCOUNTER — Other Ambulatory Visit: Payer: Self-pay

## 2019-02-10 ENCOUNTER — Ambulatory Visit
Admission: RE | Admit: 2019-02-10 | Discharge: 2019-02-10 | Disposition: A | Discharge status: Home / Self Care | Payer: No Typology Code available for payment source | Source: Ambulatory Visit | Attending: Obstetrics and Gynecology | Admitting: Obstetrics and Gynecology

## 2019-02-10 DIAGNOSIS — N611 Abscess of the breast and nipple: Secondary | ICD-10-CM

## 2019-02-17 ENCOUNTER — Other Ambulatory Visit: Payer: No Typology Code available for payment source

## 2019-03-10 ENCOUNTER — Other Ambulatory Visit: Payer: Self-pay

## 2019-03-10 ENCOUNTER — Encounter: Payer: Self-pay | Admitting: Family Medicine

## 2019-03-10 ENCOUNTER — Ambulatory Visit (INDEPENDENT_AMBULATORY_CARE_PROVIDER_SITE_OTHER): Payer: Self-pay | Admitting: Family Medicine

## 2019-03-10 DIAGNOSIS — N920 Excessive and frequent menstruation with regular cycle: Secondary | ICD-10-CM

## 2019-03-10 DIAGNOSIS — K089 Disorder of teeth and supporting structures, unspecified: Secondary | ICD-10-CM

## 2019-03-10 DIAGNOSIS — D509 Iron deficiency anemia, unspecified: Secondary | ICD-10-CM

## 2019-03-10 NOTE — Progress Notes (Deleted)
Called patient to initiate their telephone visit with provider Joaquin Courts, FNP-C. Verified date of birth. States that her fatigue is ok. Is only taking a multivitamin. KWalker, CMA.

## 2019-03-12 ENCOUNTER — Ambulatory Visit: Payer: Self-pay | Admitting: General Surgery

## 2019-03-14 ENCOUNTER — Ambulatory Visit: Payer: Self-pay | Admitting: Family Medicine

## 2019-03-15 NOTE — Progress Notes (Signed)
Virtual Visit via Telephone Note  I connected with Shelley Chen on 03/15/19 at  9:50 AM EDT by telephone and verified that I am speaking with the correct person using two identifiers.  Location: Patient: Located at home during today's encounter  Provider: Located at primary care office  Due to language barrier, an interpreter was present during the history-taking and subsequent discussion with this patient.    I discussed the limitations, risks, security and privacy concerns of performing an evaluation and management service by telephone and the availability of in person appointments. I also discussed with the patient that there may be a patient responsible charge related to this service. The patient expressed understanding and agreed to proceed.   History of Present Illness: Anemia follow-up Patient suffers from iron deficiency anemia related to heavy menstrual cycle. Patient received an infusion of Faraheme on 08/29/18. Post infusion hemoglobin 11.4 and Iron 62. She reports on-going heavy periods. She has not experienced any fatigue, dizziness, chest tightness Chen palpitations. She is requesting OBGYN referral for management of heavy cycles. She is also requesting a dental referral, through the [facility] program.  Assessment and Plan: 1. Poor dentition - Ambulatory referral to Dentistry  2. Menorrhagia with regular cycle - Ambulatory referral to Obstetrics / Gynecology -Will repeat an iron panel and CBC   3. Iron deficiency anemia, unspecified iron deficiency anemia type Repeating  CBC with Differential and Iron, TIBC and Ferritin Panel  Follow Up Instructions: Schedule lab visit 1 week.   I discussed the assessment and treatment plan with the patient. The patient was provided an opportunity to ask questions and all were answered. The patient agreed with the plan and demonstrated an understanding of the instructions.   The patient was advised to call back Chen seek an  in-person evaluation if the symptoms worsen Chen if the condition fails to improve as anticipated.  I provided 20 minutes of non-face-to-face time during this encounter.   Molli Barrows, FNP

## 2019-03-17 ENCOUNTER — Other Ambulatory Visit: Payer: Self-pay

## 2019-03-17 DIAGNOSIS — D509 Iron deficiency anemia, unspecified: Secondary | ICD-10-CM

## 2019-03-17 NOTE — Progress Notes (Signed)
Patient here for repeat labs. KWalker, CMA.

## 2019-03-18 LAB — CBC WITH DIFFERENTIAL/PLATELET
Basophils Absolute: 0.1 10*3/uL (ref 0.0–0.2)
Basos: 1 %
EOS (ABSOLUTE): 0.4 10*3/uL (ref 0.0–0.4)
Eos: 5 %
Hematocrit: 35.5 % (ref 34.0–46.6)
Hemoglobin: 10.9 g/dL — ABNORMAL LOW (ref 11.1–15.9)
Immature Grans (Abs): 0 10*3/uL (ref 0.0–0.1)
Immature Granulocytes: 0 %
Lymphocytes Absolute: 2.5 10*3/uL (ref 0.7–3.1)
Lymphs: 38 %
MCH: 26.7 pg (ref 26.6–33.0)
MCHC: 30.7 g/dL — ABNORMAL LOW (ref 31.5–35.7)
MCV: 87 fL (ref 79–97)
Monocytes Absolute: 0.5 10*3/uL (ref 0.1–0.9)
Monocytes: 7 %
Neutrophils Absolute: 3.2 10*3/uL (ref 1.4–7.0)
Neutrophils: 49 %
Platelets: 209 10*3/uL (ref 150–450)
RBC: 4.08 x10E6/uL (ref 3.77–5.28)
RDW: 12.7 % (ref 11.7–15.4)
WBC: 6.6 10*3/uL (ref 3.4–10.8)

## 2019-03-18 LAB — IRON,TIBC AND FERRITIN PANEL
Ferritin: 7 ng/mL — ABNORMAL LOW (ref 15–150)
Iron Saturation: 11 % — ABNORMAL LOW (ref 15–55)
Iron: 35 ug/dL (ref 27–159)
Total Iron Binding Capacity: 315 ug/dL (ref 250–450)
UIBC: 280 ug/dL (ref 131–425)

## 2019-04-25 ENCOUNTER — Telehealth: Payer: Self-pay | Admitting: Obstetrics & Gynecology

## 2019-04-25 NOTE — Telephone Encounter (Signed)
Called the patient to inform of upcoming visit within our office with interpreter Mariel. The mobile number recipient stated I have the wrong number. Reached the patient on the home contact number. Informed of no vistors or children due to covid19 restriction. Informed of wearing a face mask to the appointment and also new location address. Completing the screening and inform the patient if she experiences any flu like symptoms over the weekend please call our office on Monday to reschedule. The patient verbalized understanding.

## 2019-04-28 ENCOUNTER — Encounter: Payer: Self-pay | Admitting: Obstetrics and Gynecology

## 2019-04-28 ENCOUNTER — Ambulatory Visit (INDEPENDENT_AMBULATORY_CARE_PROVIDER_SITE_OTHER): Payer: No Typology Code available for payment source | Admitting: Obstetrics and Gynecology

## 2019-04-28 ENCOUNTER — Other Ambulatory Visit: Payer: Self-pay

## 2019-04-28 VITALS — BP 110/74 | HR 79 | Temp 98.4°F | Wt 149.0 lb

## 2019-04-28 DIAGNOSIS — Z3009 Encounter for other general counseling and advice on contraception: Secondary | ICD-10-CM

## 2019-04-28 DIAGNOSIS — N736 Female pelvic peritoneal adhesions (postinfective): Secondary | ICD-10-CM

## 2019-04-28 DIAGNOSIS — Z789 Other specified health status: Secondary | ICD-10-CM

## 2019-04-28 DIAGNOSIS — N946 Dysmenorrhea, unspecified: Secondary | ICD-10-CM

## 2019-04-28 DIAGNOSIS — N760 Acute vaginitis: Secondary | ICD-10-CM

## 2019-04-28 DIAGNOSIS — Z758 Other problems related to medical facilities and other health care: Secondary | ICD-10-CM

## 2019-04-28 DIAGNOSIS — Z8742 Personal history of other diseases of the female genital tract: Secondary | ICD-10-CM

## 2019-04-28 DIAGNOSIS — B9689 Other specified bacterial agents as the cause of diseases classified elsewhere: Secondary | ICD-10-CM

## 2019-04-28 DIAGNOSIS — N920 Excessive and frequent menstruation with regular cycle: Secondary | ICD-10-CM

## 2019-04-28 HISTORY — DX: Female pelvic peritoneal adhesions (postinfective): N73.6

## 2019-04-28 LAB — POCT PREGNANCY, URINE: Preg Test, Ur: NEGATIVE

## 2019-04-28 MED ORDER — METRONIDAZOLE 500 MG PO TABS: 500.0000 mg | ORAL_TABLET | Freq: Two times a day (BID) | ORAL | 0 refills | Status: AC

## 2019-04-28 NOTE — Progress Notes (Signed)
Obstetrics and Gynecology New Patient Referral Evaluation  Appointment Date: 04/28/2019  [facility]  Primary Care Provider: Bing NeighborsHarris, Kimberly S  Referring Provider: Bing NeighborsHarris, Kimberly S, FNP  Chief Complaint:  Chief Complaint  Patient presents with  . Menorrhagia  contraception management  History of Present Illness: Rowe RobertGloria Mendoza-DeLeon is a 28 y.o. Hispanic [phone] (Patient's last menstrual period was 04/17/2019), seen for the above chief complaint. Her past medical history is significant for h/o c-section, pelvic adhesive disease, iron deficiency anemia.  Patient seen in consultation from Jerrilyn CairoKim Harris NP for above CC.  Never tried anything for her menorrhagia except for OTC NSAIDs and helps some.   No current discharge, diarrhea, constipation. Has some dyspaurenia at times. Had vag d/c but no itching but sometimes with smell; no prior hx of this.   Pt had weird period this month with occasional spotting with wiping after her period.   Pt also wondering about untying her tubes  Review of Systems:  noted in the History of Present Illness.   Past Medical History:  Past Medical History:  Diagnosis Date  . 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 CESAREAN SECTION     3 previous c sections  . CESAREAN SECTION WITH BILATERAL TUBAL LIGATION Bilateral 04/30/2014   Procedure: REPEAT CESAREAN SECTION WITH BILATERAL TUBAL LIGATION;  Surgeon: Tereso NewcomerUgonna A Anyanwu, MD;  Location: [facility];  Service: Obstetrics;  Laterality: Bilateral;  . DILATION AND EVACUATION  11/12/2012   Procedure: DILATATION AND EVACUATION;  Surgeon: Reva Boresanya S Pratt, MD;  Location: [facility];  Service: Gynecology;  Laterality: N/A;  . LYSIS OF ADHESION N/A 04/30/2014   Procedure: LYSIS OF ADHESION;  Surgeon: Tereso NewcomerUgonna A Anyanwu, MD;  Location: [facility];  Service: Obstetrics;  Laterality: N/A;  . TUBAL  LIGATION      Past Obstetrical History:  OB History  Gravida Para Term Preterm AB Living  4 3 2   1 3   SAB TAB Ectopic Multiple Live Births      1   3    # Outcome Date GA Lbr Len/2nd Weight Sex Delivery Anes PTL Lv  4 Para 04/30/14   8 lb 9.9 oz (3.909 kg) F CS-LVertical EPI  LIV     Birth Comments: No problems at birth  673 Term 11/01/09     CS-Unspec     2 Term 12/02/05     CS-Unspec     1 Ectopic              Birth Comments: System Generated. Please review and update pregnancy details.    Past Gynecological History: As per HPI. Menarche age 28 y/o Periods: qmonth, regular, 7 days, heavy for 1st five days and painful. She has never been on anything for this. She was on depo in the remote past for [facility] but didn't like it.  History of Pap Smear(s): Yes.   Last pap 2019, which was negative History of STI(s): No. She is currently using BTL for contraception.   Social History:  Social History   Socioeconomic History  . Marital status: Single    Spouse name: Not on file  . Number of children: 3  . Years of education: Not on file  . Highest education level: 10th grade  Occupational History  . Not on file  Social Needs  . Financial resource strain: Not on file  . Food insecurity  Worry: Not on file    Inability: Not on file  . Transportation needs    Medical: No    Non-medical: No  Tobacco Use  . Smoking status: Never Smoker  . Smokeless tobacco: Never Used  Substance and Sexual Activity  . Alcohol use: No  . Drug use: No  . Sexual activity: Yes    Birth control/protection: Surgical  Lifestyle  . Physical activity    Days per week: Not on file    Minutes per session: Not on file  . Stress: Not on file  Relationships  . Social Herbalist on phone: Not on file    Gets together: Not on file    Attends religious service: Not on file    Active member of club or organization: Not on file    Attends meetings of clubs or organizations: Not on file     Relationship status: Not on file  . Intimate partner violence    Fear of current or ex partner: Not on file    Emotionally abused: Not on file    Physically abused: Not on file    Forced sexual activity: Not on file  Other Topics Concern  . Not on file  Social History Narrative  . Not on file    Family History: History reviewed. No pertinent family history.   Medications Enslee Bibbins had no medications administered during this visit. Current Outpatient Medications  Medication Sig Dispense Refill  . Multiple Vitamin (MULTIVITAMIN) tablet Take 1 tablet by mouth daily.     No current facility-administered medications for this visit.     Allergies Patient has no known allergies.   Physical Exam:  BP 110/74   Pulse 79   Temp 98.4 F (36.9 C)   Wt 149 lb (67.6 kg)   LMP 03/18/2019   BMI 24.05 kg/m  Body mass index is 24.05 kg/m. General appearance: Well nourished, well developed female in no acute distress.  Cardiovascular: normal s1 and s2.  No murmurs, rubs or gallops. Respiratory:  Clear to auscultation bilateral. Normal respiratory effort Abdomen: positive bowel sounds and no masses, hernias; diffusely non tender to palpation, non distended. Has vertical and low transverse well healed skin incisions.  Neuro/Psych:  Normal mood and affect.  Skin:  Warm and dry.  Lymphatic:  No inguinal lymphadenopathy.   Pelvic exam: is not limited by body habitus EGBUS: within normal limits, Vagina: normal except for scant old blood in the vault. No discharge. Cervix: normal appearing cervix without tenderness, discharge or lesions. Uterus:  8-10wks, not much mobility b/c has uterine discomfort with movement, anteverted and Adnexa:  normal adnexa and no mass, fullness, tenderness Rectovaginal: deferred  Laboratory: UPT negative  Radiology: none  Assessment: pt stable. ?adenomyosis  Plan:  1. Language barrier Interpreter used  2. Menorrhagia with regular cycle D/w  her re: concern for adenomyosis that is causing her bleeding issues and pain and pelvic adhesive dz contributing to her pain. I told her re: options for reversal of BTL and would be difficult to do due to her history. I told her to try looking into providers in [place] that do tubal reversal surgeries. I told her that if she's wanting to preserve fertility that I'd recommend OCPs (pt denies any h/o VTE, migraines) and depo shot and LNG IUD; pt is self pay but is in the [facility] assistance program. I also told her that if OTC NSAIDS (d/w her to take a day before her period  and for first two days of her period) and heating pads work then she doesn't necessarily need to do anything. R/b d/w her and she will consider OCPs and let us know. If AUB persists, consider pelvic u/s but likely just odd cycle this month.   3. Dysmenorrhea See above.   4. History of vaginal discharge Flagyl sent in for BV  RTC PRN  Cornelia Copaharlie Johnella Crumm, Jr MD Attending [facility](Faculty Practice)

## 2019-05-05 ENCOUNTER — Encounter (HOSPITAL_BASED_OUTPATIENT_CLINIC_OR_DEPARTMENT_OTHER): Payer: Self-pay | Admitting: *Deleted

## 2019-05-05 ENCOUNTER — Other Ambulatory Visit: Payer: Self-pay

## 2019-05-05 ENCOUNTER — Other Ambulatory Visit (HOSPITAL_COMMUNITY)
Admission: RE | Admit: 2019-05-05 | Discharge: 2019-05-05 | Disposition: A | Discharge status: Home / Self Care | Payer: HRSA Program | Source: Ambulatory Visit | Attending: General Surgery | Admitting: General Surgery

## 2019-05-05 DIAGNOSIS — Z20828 Contact with and (suspected) exposure to other viral communicable diseases: Secondary | ICD-10-CM | POA: Diagnosis present

## 2019-05-05 NOTE — Progress Notes (Signed)
Ensure pre surgical drink given to pt with instructions to finish by 0430 DOS. Hibiclens soap given to pt. Pt verbalized understanding.

## 2019-05-06 LAB — SARS CORONAVIRUS 2 (TAT 6-24 HRS): SARS Coronavirus 2: NEGATIVE

## 2019-05-07 NOTE — Anesthesia Preprocedure Evaluation (Addendum)
Anesthesia Evaluation  Patient identified by MRN, date of birth, ID band Patient awake    Reviewed: Allergy & Precautions, NPO status , Patient's Chart, lab work & pertinent test results  History of Anesthesia Complications Negative for: history of anesthetic complications  Airway Mallampati: II  TM Distance: <3 FB Neck ROM: Full    Dental no notable dental hx.    Pulmonary neg pulmonary ROS,    Pulmonary exam normal        Cardiovascular negative cardio ROS Normal cardiovascular exam     Neuro/Psych negative neurological ROS     GI/Hepatic negative GI ROS, Neg liver ROS,   Endo/Other  negative endocrine ROS  Renal/GU negative Renal ROS     Musculoskeletal negative musculoskeletal ROS (+)   Abdominal   Peds  Hematology  (+) anemia ,   Anesthesia Other Findings Left breast abscess  Day of surgery medications reviewed with the patient.  Reproductive/Obstetrics                           Anesthesia Physical Anesthesia Plan  ASA: II  Anesthesia Plan: General   Post-op Pain Management:    Induction: Intravenous  PONV Risk Score and Plan: 4 or greater and Treatment may vary due to age or medical condition, Ondansetron, Dexamethasone, Scopolamine patch - Pre-op and Midazolam  Airway Management Planned: LMA  Additional Equipment:   Intra-op Plan:   Post-operative Plan: Extubation in OR  Informed Consent:     Dental advisory given  Plan Discussed with: CRNA  Anesthesia Plan Comments:        Anesthesia Quick Evaluation

## 2019-05-08 ENCOUNTER — Ambulatory Visit (HOSPITAL_BASED_OUTPATIENT_CLINIC_OR_DEPARTMENT_OTHER)
Admission: RE | Admit: 2019-05-08 | Discharge: 2019-05-08 | Disposition: A | Discharge status: Home / Self Care | Payer: Self-pay | Attending: General Surgery | Admitting: General Surgery

## 2019-05-08 ENCOUNTER — Encounter (HOSPITAL_BASED_OUTPATIENT_CLINIC_OR_DEPARTMENT_OTHER)
Admission: RE | Disposition: A | Discharge status: Home / Self Care | Payer: Self-pay | Source: Home / Self Care | Attending: General Surgery

## 2019-05-08 ENCOUNTER — Ambulatory Visit (HOSPITAL_BASED_OUTPATIENT_CLINIC_OR_DEPARTMENT_OTHER): Payer: Self-pay | Admitting: Anesthesiology

## 2019-05-08 ENCOUNTER — Encounter (HOSPITAL_BASED_OUTPATIENT_CLINIC_OR_DEPARTMENT_OTHER): Payer: Self-pay | Admitting: Certified Registered"

## 2019-05-08 ENCOUNTER — Other Ambulatory Visit: Payer: Self-pay

## 2019-05-08 DIAGNOSIS — N611 Abscess of the breast and nipple: Secondary | ICD-10-CM | POA: Insufficient documentation

## 2019-05-08 HISTORY — PX: BREAST LUMPECTOMY: SHX2

## 2019-05-08 SURGERY — BREAST LUMPECTOMY
Anesthesia: General | Site: Breast | Laterality: Left

## 2019-05-08 MED ORDER — OXYCODONE HCL 5 MG/5ML PO SOLN: 5.0000 mg | Freq: Once | ORAL | Status: AC | PRN

## 2019-05-08 MED ORDER — CELECOXIB 200 MG PO CAPS
200.0000 mg | ORAL_CAPSULE | ORAL | Status: AC
  Administered 2019-05-08: 200 mg via ORAL

## 2019-05-08 MED ORDER — VANCOMYCIN HCL IN DEXTROSE 1-5 GM/200ML-% IV SOLN
1000.0000 mg | INTRAVENOUS | Status: AC
  Administered 2019-05-08: 08:00:00 1000 mg via INTRAVENOUS

## 2019-05-08 MED ORDER — ONDANSETRON HCL 4 MG/2ML IJ SOLN
INTRAMUSCULAR | Status: DC | PRN
  Administered 2019-05-08: 4 mg via INTRAVENOUS

## 2019-05-08 MED ORDER — DEXAMETHASONE SODIUM PHOSPHATE 10 MG/ML IJ SOLN
INTRAMUSCULAR | Status: DC | PRN
  Administered 2019-05-08: 10 mg via INTRAVENOUS

## 2019-05-08 MED ORDER — FENTANYL CITRATE (PF) 100 MCG/2ML IJ SOLN: 25.0000 ug | INTRAMUSCULAR | Status: DC | PRN

## 2019-05-08 MED ORDER — GABAPENTIN 300 MG PO CAPS
ORAL_CAPSULE | ORAL | Status: AC
  Filled 2019-05-08: qty 1

## 2019-05-08 MED ORDER — LIDOCAINE-EPINEPHRINE (PF) 1 %-1:200000 IJ SOLN
INTRAMUSCULAR | Status: AC
  Filled 2019-05-08: qty 30

## 2019-05-08 MED ORDER — FENTANYL CITRATE (PF) 100 MCG/2ML IJ SOLN: 50.0000 ug | INTRAMUSCULAR | Status: DC | PRN

## 2019-05-08 MED ORDER — OXYCODONE HCL 5 MG PO TABS
ORAL_TABLET | ORAL | Status: AC
  Filled 2019-05-08: qty 1

## 2019-05-08 MED ORDER — BUPIVACAINE HCL (PF) 0.25 % IJ SOLN
INTRAMUSCULAR | Status: AC
  Filled 2019-05-08: qty 30

## 2019-05-08 MED ORDER — MIDAZOLAM HCL 5 MG/5ML IJ SOLN
INTRAMUSCULAR | Status: DC | PRN
  Administered 2019-05-08: 2 mg via INTRAVENOUS

## 2019-05-08 MED ORDER — CELECOXIB 200 MG PO CAPS
ORAL_CAPSULE | ORAL | Status: AC
  Filled 2019-05-08: qty 1

## 2019-05-08 MED ORDER — OXYCODONE HCL 5 MG PO TABS
5.0000 mg | ORAL_TABLET | Freq: Once | ORAL | Status: AC | PRN
  Administered 2019-05-08: 5 mg via ORAL

## 2019-05-08 MED ORDER — SCOPOLAMINE 1 MG/3DAYS TD PT72: 1.0000 | MEDICATED_PATCH | Freq: Once | TRANSDERMAL | Status: DC

## 2019-05-08 MED ORDER — PROMETHAZINE HCL 25 MG/ML IJ SOLN
6.2500 mg | INTRAMUSCULAR | Status: DC | PRN
  Administered 2019-05-08: 6.25 mg via INTRAVENOUS

## 2019-05-08 MED ORDER — FENTANYL CITRATE (PF) 100 MCG/2ML IJ SOLN
INTRAMUSCULAR | Status: DC | PRN
  Administered 2019-05-08: 50 ug via INTRAVENOUS

## 2019-05-08 MED ORDER — HEPARIN SOD (PORK) LOCK FLUSH 100 UNIT/ML IV SOLN
INTRAVENOUS | Status: AC
  Filled 2019-05-08: qty 5

## 2019-05-08 MED ORDER — PROMETHAZINE HCL 25 MG/ML IJ SOLN
INTRAMUSCULAR | Status: AC
  Filled 2019-05-08: qty 1

## 2019-05-08 MED ORDER — ONDANSETRON HCL 4 MG/2ML IJ SOLN
INTRAMUSCULAR | Status: AC
  Filled 2019-05-08: qty 2

## 2019-05-08 MED ORDER — LIDOCAINE 2% (20 MG/ML) 5 ML SYRINGE
INTRAMUSCULAR | Status: DC | PRN
  Administered 2019-05-08: 100 mg via INTRAVENOUS

## 2019-05-08 MED ORDER — CHLORHEXIDINE GLUCONATE CLOTH 2 % EX PADS: 6.0000 | MEDICATED_PAD | Freq: Once | CUTANEOUS | Status: DC

## 2019-05-08 MED ORDER — BUPIVACAINE-EPINEPHRINE 0.25% -1:200000 IJ SOLN
INTRAMUSCULAR | Status: DC | PRN
  Administered 2019-05-08: 10 mL

## 2019-05-08 MED ORDER — LACTATED RINGERS IV SOLN
INTRAVENOUS | Status: DC
  Administered 2019-05-08: 11:00:00 via INTRAVENOUS

## 2019-05-08 MED ORDER — LIDOCAINE 2% (20 MG/ML) 5 ML SYRINGE
INTRAMUSCULAR | Status: AC
  Filled 2019-05-08: qty 5

## 2019-05-08 MED ORDER — BUPIVACAINE-EPINEPHRINE (PF) 0.25% -1:200000 IJ SOLN
INTRAMUSCULAR | Status: AC
  Filled 2019-05-08: qty 30

## 2019-05-08 MED ORDER — VANCOMYCIN HCL IN DEXTROSE 1-5 GM/200ML-% IV SOLN
INTRAVENOUS | Status: AC
  Filled 2019-05-08: qty 200

## 2019-05-08 MED ORDER — ACETAMINOPHEN 10 MG/ML IV SOLN: 1000.0000 mg | Freq: Once | INTRAVENOUS | Status: DC | PRN

## 2019-05-08 MED ORDER — FENTANYL CITRATE (PF) 100 MCG/2ML IJ SOLN
INTRAMUSCULAR | Status: AC
  Filled 2019-05-08: qty 2

## 2019-05-08 MED ORDER — GABAPENTIN 300 MG PO CAPS
300.0000 mg | ORAL_CAPSULE | ORAL | Status: AC
  Administered 2019-05-08: 300 mg via ORAL

## 2019-05-08 MED ORDER — MIDAZOLAM HCL 2 MG/2ML IJ SOLN
INTRAMUSCULAR | Status: AC
  Filled 2019-05-08: qty 2

## 2019-05-08 MED ORDER — HEPARIN (PORCINE) IN NACL 1000-0.9 UT/500ML-% IV SOLN
INTRAVENOUS | Status: AC
  Filled 2019-05-08: qty 500

## 2019-05-08 MED ORDER — PROPOFOL 10 MG/ML IV BOLUS
INTRAVENOUS | Status: AC
  Filled 2019-05-08: qty 20

## 2019-05-08 MED ORDER — MIDAZOLAM HCL 2 MG/2ML IJ SOLN: 1.0000 mg | INTRAMUSCULAR | Status: DC | PRN

## 2019-05-08 MED ORDER — ACETAMINOPHEN 500 MG PO TABS
ORAL_TABLET | ORAL | Status: AC
  Filled 2019-05-08: qty 2

## 2019-05-08 MED ORDER — ACETAMINOPHEN 500 MG PO TABS
1000.0000 mg | ORAL_TABLET | ORAL | Status: AC
  Administered 2019-05-08: 1000 mg via ORAL

## 2019-05-08 MED ORDER — MEPERIDINE HCL 25 MG/ML IJ SOLN
INTRAMUSCULAR | Status: AC
  Filled 2019-05-08: qty 1

## 2019-05-08 MED ORDER — HYDROCODONE-ACETAMINOPHEN 5-325 MG PO TABS: 1.0000 | ORAL_TABLET | Freq: Four times a day (QID) | ORAL | 0 refills | Status: DC | PRN

## 2019-05-08 MED ORDER — LACTATED RINGERS IV SOLN
INTRAVENOUS | Status: DC | PRN
  Administered 2019-05-08: 08:00:00 via INTRAVENOUS

## 2019-05-08 MED ORDER — PROPOFOL 10 MG/ML IV BOLUS
INTRAVENOUS | Status: DC | PRN
  Administered 2019-05-08: 50 mg via INTRAVENOUS
  Administered 2019-05-08: 150 mg via INTRAVENOUS

## 2019-05-08 MED ORDER — DEXAMETHASONE SODIUM PHOSPHATE 10 MG/ML IJ SOLN
INTRAMUSCULAR | Status: AC
  Filled 2019-05-08: qty 1

## 2019-05-08 MED ORDER — MEPERIDINE HCL 25 MG/ML IJ SOLN
6.2500 mg | INTRAMUSCULAR | Status: DC | PRN
  Administered 2019-05-08: 6.25 mg via INTRAVENOUS

## 2019-05-08 SURGICAL SUPPLY — 45 items
APPLIER CLIP 9.375 MED OPEN (MISCELLANEOUS)
BLADE SURG 15 STRL LF DISP TIS (BLADE) ×1 IMPLANT
BLADE SURG 15 STRL SS (BLADE) ×2
CANISTER SUCT 1200ML W/VALVE (MISCELLANEOUS) ×3 IMPLANT
CHLORAPREP W/TINT 26 (MISCELLANEOUS) ×3 IMPLANT
CLIP APPLIE 9.375 MED OPEN (MISCELLANEOUS) IMPLANT
CLIP VESOCCLUDE SM WIDE 6/CT (CLIP) IMPLANT
COVER BACK TABLE REUSABLE LG (DRAPES) ×3 IMPLANT
COVER MAYO STAND REUSABLE (DRAPES) ×3 IMPLANT
COVER WAND RF STERILE (DRAPES) IMPLANT
DECANTER SPIKE VIAL GLASS SM (MISCELLANEOUS) ×1 IMPLANT
DERMABOND ADVANCED (GAUZE/BANDAGES/DRESSINGS) ×2
DERMABOND ADVANCED .7 DNX12 (GAUZE/BANDAGES/DRESSINGS) ×1 IMPLANT
DRAPE LAPAROSCOPIC ABDOMINAL (DRAPES) ×3 IMPLANT
DRAPE UTILITY XL STRL (DRAPES) ×3 IMPLANT
ELECT COATED BLADE 2.86 ST (ELECTRODE) ×3 IMPLANT
ELECT REM PT RETURN 9FT ADLT (ELECTROSURGICAL) ×3
ELECTRODE REM PT RTRN 9FT ADLT (ELECTROSURGICAL) ×1 IMPLANT
GLOVE BIO SURGEON STRL SZ 6.5 (GLOVE) ×1 IMPLANT
GLOVE BIO SURGEON STRL SZ7.5 (GLOVE) ×3 IMPLANT
GLOVE BIO SURGEONS STRL SZ 6.5 (GLOVE) ×1
GLOVE BIOGEL PI IND STRL 6.5 (GLOVE) IMPLANT
GLOVE BIOGEL PI INDICATOR 6.5 (GLOVE) ×2
GLOVE EXAM NITRILE MD LF STRL (GLOVE) ×2 IMPLANT
GOWN STRL REUS W/ TWL LRG LVL3 (GOWN DISPOSABLE) ×2 IMPLANT
GOWN STRL REUS W/TWL LRG LVL3 (GOWN DISPOSABLE) ×4
ILLUMINATOR WAVEGUIDE N/F (MISCELLANEOUS) IMPLANT
KIT MARKER MARGIN INK (KITS) IMPLANT
LIGHT WAVEGUIDE WIDE FLAT (MISCELLANEOUS) IMPLANT
NDL HYPO 25X1 1.5 SAFETY (NEEDLE) ×1 IMPLANT
NEEDLE HYPO 25X1 1.5 SAFETY (NEEDLE) ×3 IMPLANT
NS IRRIG 1000ML POUR BTL (IV SOLUTION) ×3 IMPLANT
PACK BASIN DAY SURGERY FS (CUSTOM PROCEDURE TRAY) ×3 IMPLANT
PENCIL BUTTON HOLSTER BLD 10FT (ELECTRODE) ×3 IMPLANT
SLEEVE SCD COMPRESS KNEE MED (MISCELLANEOUS) ×3 IMPLANT
SPONGE LAP 18X18 RF (DISPOSABLE) ×3 IMPLANT
SUT MON AB 4-0 PC3 18 (SUTURE) ×3 IMPLANT
SUT SILK 2 0 SH (SUTURE) ×1 IMPLANT
SUT VICRYL 3-0 CR8 SH (SUTURE) ×3 IMPLANT
SYR CONTROL 10ML LL (SYRINGE) ×3 IMPLANT
TOWEL GREEN STERILE FF (TOWEL DISPOSABLE) ×3 IMPLANT
TRAY FAXITRON CT DISP (TRAY / TRAY PROCEDURE) IMPLANT
TUBE CONNECTING 20'X1/4 (TUBING) ×1
TUBE CONNECTING 20X1/4 (TUBING) ×2 IMPLANT
YANKAUER SUCT BULB TIP NO VENT (SUCTIONS) ×3 IMPLANT

## 2019-05-08 NOTE — Anesthesia Procedure Notes (Addendum)
Procedure Name: LMA Insertion Date/Time: 05/08/2019 8:11 AM Performed by: Gwyndolyn Saxon, CRNA Pre-anesthesia Checklist: Patient identified, Emergency Drugs available, Patient being monitored and Suction available Patient Re-evaluated:Patient Re-evaluated prior to induction Oxygen Delivery Method: Circle system utilized Preoxygenation: Pre-oxygenation with 100% oxygen Induction Type: IV induction Ventilation: Mask ventilation without difficulty LMA: LMA inserted LMA Size: 3.0 Number of attempts: 1 Placement Confirmation: positive ETCO2 and breath sounds checked- equal and bilateral Tube secured with: Tape Dental Injury: Teeth and Oropharynx as per pre-operative assessment  Difficulty Due To: Difficult Airway- due to limited oral opening Comments: Small mouth opening on preop exam. After induction, mouth opening approx 2cm. Somewhat difficult to fit LMA #3 into mouth. If intubation required in future, would have small Glidescope blade and potentially fiberoptic scope available. -Bary Leriche, MD

## 2019-05-08 NOTE — Interval H&P Note (Signed)
History and Physical Interval Note:  05/08/2019 7:46 AM  [name]  has presented today for surgery, with the diagnosis of LEFT BREAST CHRONIC ABSCESS.  The various methods of treatment have been discussed with the patient and family. After consideration of risks, benefits and other options for treatment, the patient has consented to  Procedure(s): LEFT BREAST LUMPECTOMY (Left) as a surgical intervention.  The patient's history has been reviewed, patient examined, no change in status, stable for surgery.  I have reviewed the patient's chart and labs.  Questions were answered to the patient's satisfaction.     Shelley Chen

## 2019-05-08 NOTE — Anesthesia Postprocedure Evaluation (Signed)
Anesthesia Post Note  Patient: Shelley Chen  Procedure(s) Performed: LEFT BREAST LUMPECTOMY (Left Breast)     Patient location during evaluation: PACU Anesthesia Type: General Level of consciousness: awake and alert Pain management: pain level controlled Vital Signs Assessment: post-procedure vital signs reviewed and stable Respiratory status: spontaneous breathing, nonlabored ventilation and respiratory function stable Cardiovascular status: blood pressure returned to baseline and stable Postop Assessment: no apparent nausea or vomiting Anesthetic complications: no    Last Vitals:  Vitals:   05/08/19 0930 05/08/19 0940  BP: 109/65 100/61  Pulse: 72 69  Resp: 17 17  Temp:    SpO2: 100% 100%    Last Pain:  Vitals:   05/08/19 0929  TempSrc:   PainSc: [place]

## 2019-05-08 NOTE — H&P (Signed)
Shelley Chen  Location: [facility] Patient #: [id number] DOB: 03/12/1991 Married / Language: Undefined / Race: Undefined Female   History of Present Illness  The patient is a 28 year old female who presents for a follow-up for Breast abscess. 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 This has been aspirated and she has been treated with multiple rounds of antibiotics. The inflamed irritated area in the medial left breast will not resolve. She denies any fevers or chills. She denies any current drainage. She does report significant pain at the site.   Allergies  No Known Allergies  No Known Drug Allergies    Medication History No Current Medications Medications Reconciled    Review of Systems  General Not Present- Appetite Loss, Chills, Fatigue, Fever, Night Sweats, Weight Gain and Weight Loss. Skin Not Present- Change in Wart/Mole, Dryness, Hives, Jaundice, New Lesions, Non-Healing Wounds, Rash and Ulcer. Breast Present- Breast Mass, Breast Pain and Skin Changes. Not Present- Nipple Discharge. Cardiovascular Not Present- Chest Pain, Difficulty Breathing Lying Down, Leg Cramps, Palpitations, Rapid Heart Rate, Shortness of Breath and Swelling of Extremities. Gastrointestinal Not Present- Abdominal Pain, Bloating, Bloody Stool, Change in Bowel Habits, Chronic diarrhea, Constipation, Difficulty Swallowing, Excessive gas, Gets full quickly at meals, Hemorrhoids, Indigestion, Nausea, Rectal Pain and Vomiting. Female Genitourinary Not Present- Frequency, Nocturia, Painful Urination, Pelvic Pain and Urgency. Musculoskeletal Not Present- Back Pain, Joint Pain, Joint Stiffness, Muscle Pain, Muscle Weakness and Swelling of Extremities. Neurological Not Present- Decreased Memory, Fainting, Headaches, Numbness, Seizures, Tingling, Tremor, Trouble walking and Weakness. Psychiatric Not  Present- Anxiety, Bipolar, Change in Sleep Pattern, Depression, Fearful and Frequent crying. Endocrine Not Present- Cold Intolerance, Excessive Hunger, Hair Changes, Heat Intolerance, Hot flashes and New Diabetes. Hematology Not Present- Blood Thinners, Easy Bruising, Excessive bleeding, Gland problems, HIV and Persistent Infections.  Vitals  Weight: 144.25 lb Height: 66in Body Surface Area: 1.74 m Body Mass Index: 23.28 kg/m  Temp.: 97.35F (Temporal)  Pulse: 98 (Regular)  BP: 116/80(Sitting, Left Arm, Standard)       Physical Exam  General Mental Status-Alert. General Appearance-Consistent with stated age. Hydration-Well hydrated. Voice-Normal.  Head and Neck Head-normocephalic, atraumatic with no lesions or palpable masses. Trachea-midline. Thyroid Gland Characteristics - normal size and consistency.  Eye Eyeball - Bilateral-Extraocular movements intact. Sclera/Conjunctiva - Bilateral-No scleral icterus.  Chest and Lung Exam Chest and lung exam reveals -quiet, even and easy respiratory effort with no use of accessory muscles and on auscultation, normal breath sounds, no adventitious sounds and normal vocal resonance. Inspection Chest Wall - Normal. Back - normal.  Breast Note: There is a chronically irritated ulceration in the skin of the medial left breast. There is no surrounding cellulitis. There is no purulent drainage today. The area has significant pain to palpation.   Cardiovascular Cardiovascular examination reveals -normal heart sounds, regular rate and rhythm with no murmurs and normal pedal pulses bilaterally.  Abdomen Inspection Inspection of the abdomen reveals - No Hernias. Skin - Scar - no surgical scars. Palpation/Percussion Palpation and Percussion of the abdomen reveal - Soft, Non Tender, No Rebound tenderness, No Rigidity (guarding) and No hepatosplenomegaly. Auscultation Auscultation of the abdomen reveals -  Bowel sounds normal.  Neurologic Neurologic evaluation reveals -alert and oriented x 3 with no impairment of recent or remote memory. Mental Status-Normal.  Musculoskeletal Normal Exam - Left-Upper Extremity Strength Normal and Lower Extremity Strength Normal. Normal Exam - Right-Upper  Extremity Strength Normal and Lower Extremity Strength Normal.  Lymphatic Head & Neck  General Head & Neck Lymphatics: Bilateral - Description - Normal. Axillary  General Axillary Region: Bilateral - Description - Normal. Tenderness - Non Tender. Femoral & Inguinal  Generalized Femoral & Inguinal Lymphatics: Bilateral - Description - Normal. Tenderness - Non Tender.    Assessment & Plan  BREAST ABSCESS (N61.1) Impression: The patient appears to have a chronic draining tract at the site where she has had a previous abscess. At this point I do not think this is can resolve on its own. I think there is a reasonable chance of getting it to heal by excising the chronic irritated tissue. I have discussed with her in detail the risks and benefits of the operation as well as some of the technical aspects and she understands and wishes to proceed. She understands that there is a risk of repeat infection even if we remove the entire tract and if that happens we may have to open the incision and let it heal secondarily which will leave a little larger scar.

## 2019-05-08 NOTE — Transfer of Care (Signed)
Immediate Anesthesia Transfer of Care Note  Patient: Shelley Chen  Procedure(s) Performed: LEFT BREAST LUMPECTOMY (Left Breast)  Patient Location: PACU  Anesthesia Type:General  Level of Consciousness: drowsy  Airway & Oxygen Therapy: Patient Spontanous Breathing and Patient connected to face mask oxygen  Post-op Assessment: Report given to RN and Post -op Vital signs reviewed and stable  Post vital signs: Reviewed and stable  Last Vitals:  Vitals Value Taken Time  BP 91/57 05/08/19 0845  Temp 36.6 C 05/08/19 0840  Pulse 67 05/08/19 0858  Resp 12 05/08/19 0858  SpO2 100 % 05/08/19 0858  Vitals shown include unvalidated device data.  Last Pain:  Vitals:   05/08/19 0840  TempSrc:   PainSc: 0-No pain         Complications: No apparent anesthesia complications

## 2019-05-08 NOTE — Op Note (Signed)
05/08/2019  8:33 AM  PATIENT:  Shelley Chen  28 y.o. female  PRE-OPERATIVE DIAGNOSIS:  LEFT BREAST CHRONIC ABSCESS  POST-OPERATIVE DIAGNOSIS:  LEFT BREAST CHRONIC ABSCESS  PROCEDURE:  Procedure(s): LEFT BREAST LUMPECTOMY (Left)  SURGEON:  Surgeon(s) and Role:    * Jovita Kussmaul, MD - Primary  PHYSICIAN ASSISTANT:   ASSISTANTS: none   ANESTHESIA:   local and general  EBL:  2 mL   BLOOD ADMINISTERED:none  DRAINS: none   LOCAL MEDICATIONS USED:  MARCAINE     SPECIMEN:  Source of Specimen:  left breast tissue  DISPOSITION OF SPECIMEN:  PATHOLOGY  COUNTS:  YES  TOURNIQUET:  * No tourniquets in log *  DICTATION: .Dragon Dictation   After informed consent was obtained the patient was brought to the operating room and placed in the supine position on the operating table.  After adequate induction of general anesthesia the patient's left breast was prepped with ChloraPrep, allowed to dry, and draped in usual sterile manner.  An appropriate timeout was performed.  The patient had an area of chronic drainage just medial to the edge of the areola of the left breast.  The area around this was infiltrated with quarter percent Marcaine.  A crescent shaped incision was then made at the medial edge of the areola of the left breast to include the area of abnormality.  This was done with a 15 blade knife.  The incision was carried through the skin and subcutaneous tissue sharply with a 15 blade knife.  The abnormal area was completely excised and sent to pathology for further evaluation.  There was no evidence of deep abscess.  Hemostasis was achieved using the Bovie electrocautery.  The incision was then closed with interrupted 4-0 Monocryl subcuticular stitches.  Dermabond dressings were applied.  The patient tolerated the procedure well.  At the end of the case all needle sponge and instrument counts were correct.  The patient was then awakened and taken to recovery in stable  condition.  PLAN OF CARE: Discharge to home after PACU  PATIENT DISPOSITION:  PACU - hemodynamically stable.   Delay start of Pharmacological VTE agent (>24hrs) due to surgical blood loss Chen risk of bleeding: not applicable

## 2019-05-08 NOTE — Discharge Instructions (Signed)

## 2019-05-09 ENCOUNTER — Encounter (HOSPITAL_BASED_OUTPATIENT_CLINIC_OR_DEPARTMENT_OTHER): Payer: Self-pay | Admitting: General Surgery

## 2019-07-15 ENCOUNTER — Telehealth: Payer: Self-pay | Admitting: Family Medicine

## 2019-07-15 NOTE — Telephone Encounter (Signed)
Patient hasn't had recent labs at [facility].

## 2019-07-15 NOTE — Telephone Encounter (Signed)
Patient called wanting to get her lab results please follow up. °

## 2019-07-17 ENCOUNTER — Telehealth: Payer: Self-pay | Admitting: Pediatric Intensive Care

## 2019-07-17 NOTE — Telephone Encounter (Signed)
Call from client with [place] interpretation.

## 2019-07-17 NOTE — Telephone Encounter (Signed)
HIPAA compliant message with interpretation for client to call CN back at her convenience. Lisette Abu RN BSN CNP [phone]

## 2019-07-21 ENCOUNTER — Ambulatory Visit (HOSPITAL_COMMUNITY)
Admission: EM | Admit: 2019-07-21 | Discharge: 2019-07-21 | Disposition: A | Discharge status: Home / Self Care | Payer: Self-pay | Attending: Family Medicine | Admitting: Family Medicine

## 2019-07-21 ENCOUNTER — Encounter (HOSPITAL_COMMUNITY): Payer: Self-pay

## 2019-07-21 ENCOUNTER — Other Ambulatory Visit: Payer: Self-pay

## 2019-07-21 DIAGNOSIS — S90222A Contusion of left lesser toe(s) with damage to nail, initial encounter: Secondary | ICD-10-CM | POA: Insufficient documentation

## 2019-07-21 DIAGNOSIS — R58 Hemorrhage, not elsewhere classified: Secondary | ICD-10-CM | POA: Insufficient documentation

## 2019-07-21 DIAGNOSIS — R202 Paresthesia of skin: Secondary | ICD-10-CM | POA: Insufficient documentation

## 2019-07-21 LAB — CBC
HCT: 37.4 % (ref 36.0–46.0)
Hemoglobin: 11.5 g/dL — ABNORMAL LOW (ref 12.0–15.0)
MCH: 25.7 pg — ABNORMAL LOW (ref 26.0–34.0)
MCHC: 30.7 g/dL (ref 30.0–36.0)
MCV: 83.7 fL (ref 80.0–100.0)
Platelets: 234 10*3/uL (ref 150–400)
RBC: 4.47 MIL/uL (ref 3.87–5.11)
RDW: 13.1 % (ref 11.5–15.5)
WBC: 5.5 10*3/uL (ref 4.0–10.5)
nRBC: 0 % (ref 0.0–0.2)

## 2019-07-21 LAB — TSH: TSH: 1.044 u[IU]/mL (ref 0.350–4.500)

## 2019-07-21 LAB — POCT URINALYSIS DIP (DEVICE)
Bilirubin Urine: NEGATIVE
Glucose, UA: NEGATIVE mg/dL
Ketones, ur: NEGATIVE mg/dL
Leukocytes,Ua: NEGATIVE
Nitrite: NEGATIVE
Protein, ur: NEGATIVE mg/dL
Specific Gravity, Urine: 1.02 (ref 1.005–1.030)
Urobilinogen, UA: 0.2 mg/dL (ref 0.0–1.0)
pH: 5 (ref 5.0–8.0)

## 2019-07-21 LAB — PROTIME-INR
INR: 1.1 (ref 0.8–1.2)
Prothrombin Time: 13.6 seconds (ref 11.4–15.2)

## 2019-07-21 MED ORDER — GABAPENTIN 100 MG PO CAPS: 100.0000 mg | ORAL_CAPSULE | Freq: Every day | ORAL | 0 refills | Status: DC

## 2019-07-21 NOTE — ED Provider Notes (Signed)
[facility]    CSN: [id number] Arrival date & time: 07/21/19  1207      History   Chief Complaint Chief Complaint  Patient presents with  . Leg Pain    Both  . Rash    HPI Shelley Chen is a 28 y.o. female.   This is the initial Shelley Chen urgent care visit for this 28 year old woman.  She complains of both legs burning circumferentially. Symptoms started over a week ago.   Also, patient has an unexplained bruise on her left forearm and subungual hematomas on each great toe.  Finally, patient notes hair loss and intermittent spotting.  LMP 2 months ago.     Past Medical History:  Diagnosis Date  . 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 Language barrier 04/28/2019  . Iron deficiency anemia 07/01/2013    Past Surgical History:  Procedure Laterality Date  . BREAST LUMPECTOMY Left 05/08/2019   Procedure: LEFT BREAST LUMPECTOMY;  Surgeon: Griselda Miner, MD;  Location: [facility];  Service: General;  Laterality: Left;  . CESAREAN SECTION     3 previous c sections  . CESAREAN SECTION WITH BILATERAL TUBAL LIGATION Bilateral 04/30/2014   Procedure: REPEAT CESAREAN SECTION WITH BILATERAL TUBAL LIGATION;  Surgeon: Tereso Newcomer, MD;  Location: [facility];  Service: Obstetrics;  Laterality: Bilateral;  . DILATION AND EVACUATION  11/12/2012   Procedure: DILATATION AND EVACUATION;  Surgeon: Reva Bores, MD;  Location: [facility];  Service: Gynecology;  Laterality: N/A;  . LYSIS OF ADHESION N/A 04/30/2014   Procedure: LYSIS OF ADHESION;  Surgeon: Tereso Newcomer, MD;  Location: [facility];  Service: Obstetrics;  Laterality: N/A;  . TUBAL LIGATION      OB History    Gravida  4   Para  3   Term  2   Preterm      AB  1   Living  3     SAB      TAB      Ectopic  1   Multiple      Live Births  3            Home  Medications    Prior to Admission medications   Medication Sig Start Date End Date Taking? Authorizing Provider  gabapentin (NEURONTIN) 100 MG capsule Take 1 capsule (100 mg total) by mouth at bedtime. 07/21/19   [name], MD  Multiple Vitamin (MULTIVITAMIN) tablet Take 1 tablet by mouth daily.    [provider]    Family History History reviewed. No pertinent family history.  Social History Social History   Tobacco Use  . Smoking status: Never Smoker  . Smokeless tobacco: Never Used  Substance Use Topics  . Alcohol use: No  . Drug use: No     Allergies   Patient has no known allergies.   Review of Systems Review of Systems  Constitutional: Negative.   Genitourinary: Positive for menstrual problem.  Skin: Positive for color change.  All other systems reviewed and are negative.    Physical Exam Triage Vital Signs ED Triage Vitals [07/21/19 1300]  Enc Vitals Group     BP 103/65     Pulse Rate 60     Resp 16     Temp 98.1 F (36.7 C)     Temp Source Skin  SpO2 100 %     Weight      Height      Head Circumference      Peak Flow      Pain Score 8     Pain Loc      Pain Edu?      Excl. in [place]?    No data found.  Updated Vital Signs BP 103/65 (BP Location: Left Arm)   Pulse 60   Temp 98.1 F (36.7 C) (Skin)   Resp 16   SpO2 100%    Physical Exam Vitals signs and nursing note reviewed.  Constitutional:      Appearance: Normal appearance. She is normal weight.  HENT:     Mouth/Throat:     Pharynx: Oropharynx is clear.  Eyes:     Conjunctiva/sclera: Conjunctivae normal.  Neck:     Musculoskeletal: Normal range of motion and neck supple.  Pulmonary:     Effort: Pulmonary effort is normal.  Skin:    General: Skin is warm and dry.     Findings: Bruising present.     Comments: 1.5 cm volar left forearm ecchymosis Bilateral subungual hematomas of big toes   Neurological:     General: No focal deficit present.     Mental  Status: She is alert.  Psychiatric:        Mood and Affect: Mood normal.        Behavior: Behavior normal.        Thought Content: Thought content normal.      UC Treatments / Results  Labs (all labs ordered are listed, but only abnormal results are displayed) Labs Reviewed  CBC - Abnormal; Notable for the following components:      Result Value   Hemoglobin 11.5 (*)    MCH 25.7 (*)    All other components within normal limits  POCT URINALYSIS DIP (DEVICE) - Abnormal; Notable for the following components:   Hgb urine dipstick TRACE (*)    All other components within normal limits  PROTIME-INR  TSH    EKG   Radiology No results found.  Procedures Procedures (including critical care time)  Medications Ordered in UC Medications - No data to display  Initial Impression / Assessment and Plan / UC Course  I have reviewed the triage vital signs and the nursing notes.  Pertinent labs & imaging results that were available during my care of the patient were reviewed by me and considered in my medical decision making (see chart for details).     Final Clinical Impressions(s) / UC Diagnoses   Final diagnoses:  Ecchymosis  Subungual hematoma of toe of left foot, initial encounter  Paresthesia     Discharge Instructions     Follow-up with your personal doctor in 1 week if symptoms persist.  Your lab tests are normal.    ED Prescriptions    Medication Sig Dispense Auth. Provider   gabapentin (NEURONTIN) 100 MG capsule Take 1 capsule (100 mg total) by mouth at bedtime. 14 capsule Robyn Haber, MD     I have reviewed the PDMP during this encounter.   Robyn Haber, MD 07/21/19 1436

## 2019-07-21 NOTE — ED Triage Notes (Signed)
Pt present both legs are burning. Symptoms started over a week ago. Pt also has some bumps that have appeared on her arms

## 2019-07-21 NOTE — Discharge Instructions (Signed)
Follow-up with your personal doctor in 1 week if symptoms persist.  Your lab tests are normal.

## 2019-07-22 ENCOUNTER — Telehealth: Payer: Self-pay | Admitting: Pediatric Intensive Care

## 2019-07-22 ENCOUNTER — Telehealth: Payer: Self-pay

## 2019-07-22 NOTE — Telephone Encounter (Signed)
Call to client with interpretation-ID verified x 2 identifiers. Client states 2 week history of numbness and pain in lower extremities with a "purple toe". Client states that provider told her this is due to her missed periods. CN advised client that she can assist with follow up appointment. Client consents. CN also advised that client can come to [facility] this afternoon to recieve a flu vaccination.  CN will contact client regarding appointment. Lisette Abu RN BSN CNP [phone]

## 2019-07-22 NOTE — Telephone Encounter (Signed)
Message received from [place] Hussey,RN/CN requesting a hospital follow up appt for patient. Informed her that appt has been scheduled  - 08/07/2019 @ 0950 @ [facility].

## 2019-08-07 ENCOUNTER — Ambulatory Visit: Payer: Self-pay

## 2020-04-30 IMAGING — US US BREAST CYST ASPIRATION 1ST CYST
1 series · 1 of 1 positions shown · non-contrast
Comparison: Previous exams.

CLINICAL DATA: 27-year-old female status post aspiration of an
abscess in the 9 o'clock region of the left breast on 12/12/2018.
Gram stain revealed abundant white blood cells present. No organisms
seen. Culture revealed no growth aerobically or anaerobically.
Patient was treated with Augmentin.

EXAM:
ULTRASOUND GUIDED LEFT BREAST CYST ASPIRATION

[Series 1: us breast cyst aspiration 1st cyst · 0.06mm/px · 1 of 1 slices shown]
[im 1/1]
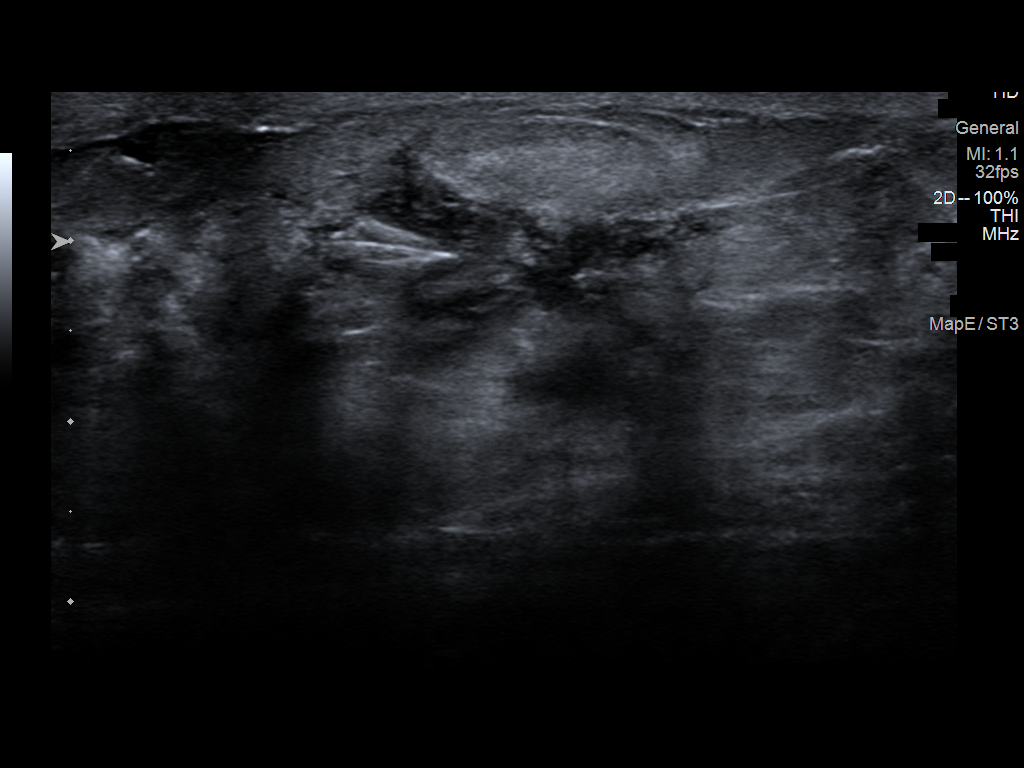

[1 of 1 positions shown; findings below may reference images not displayed]

PROCEDURE:
Using sterile technique, 1% lidocaine, under direct ultrasound
visualization, needle aspiration of a complex fluid collection in
the left breast at 9 o'clock was performed. Approximately 1 cc of
bloody purulent fluid was aspirated.
IMPRESSION: Ultrasound-guided aspiration of the left breast no apparent
complications.

RECOMMENDATIONS:
Patient will be treated with a 10 day course of doxycycline 100 mg
b.i.d. Short-term interval follow-up ultrasound in 10 days is
recommended. If the abscess does not improve/resolve in 10 days
surgical consultation would be recommended.

## 2020-06-11 IMAGING — US ULTRASOUND LEFT BREAST LIMITED
1 series · 9 of 9 positions shown · non-contrast
Comparison: Previous exam(s).

CLINICAL DATA: 28-year-old patient presents for follow-up of left
breast infection. She continues to have focal pain in the 9 o'clock
position left breast approximately 1 cm from the nipple. She denies
any drainage from the skin. She is concerned that the area is not
improving much. Spanish interpreter was present during the patient's
visit today. She has completed 2 rounds of antibiotics, including
Augmentin and doxycycline. The doxycycline was completed on
01/03/2019. She has had 2 breast aspirations, with no organisms
grown at culture from the aspiration December 12, 2018. A second
aspiration was performed December 23, 2018, yielding 1 cc of fluid.

EXAM:
ULTRASOUND OF THE LEFT BREAST

[Series 1: ultrasound left breast limited · 0.06mm/px · 9 of 9 slices shown]
[im 1/9]
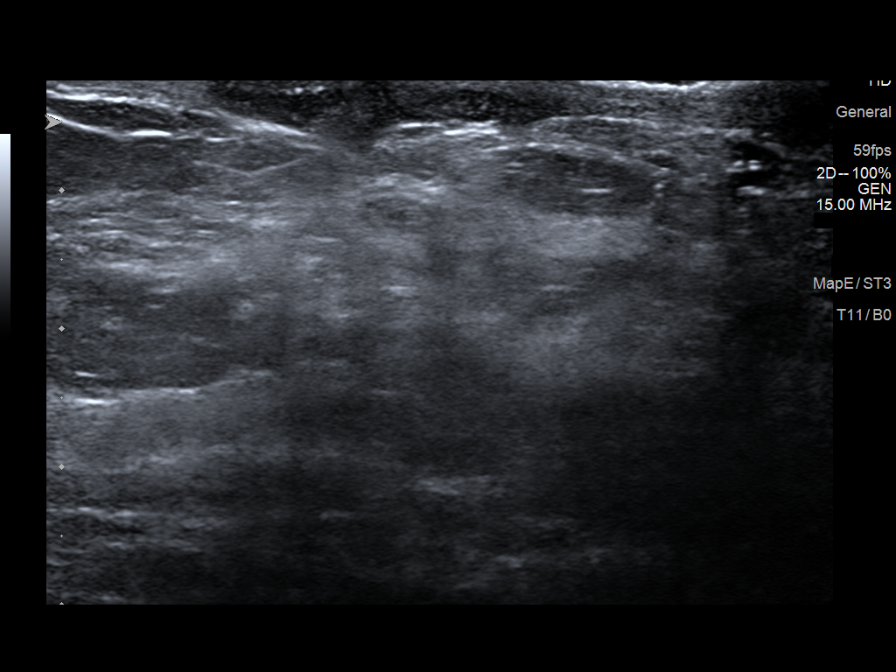
[im 2/9]
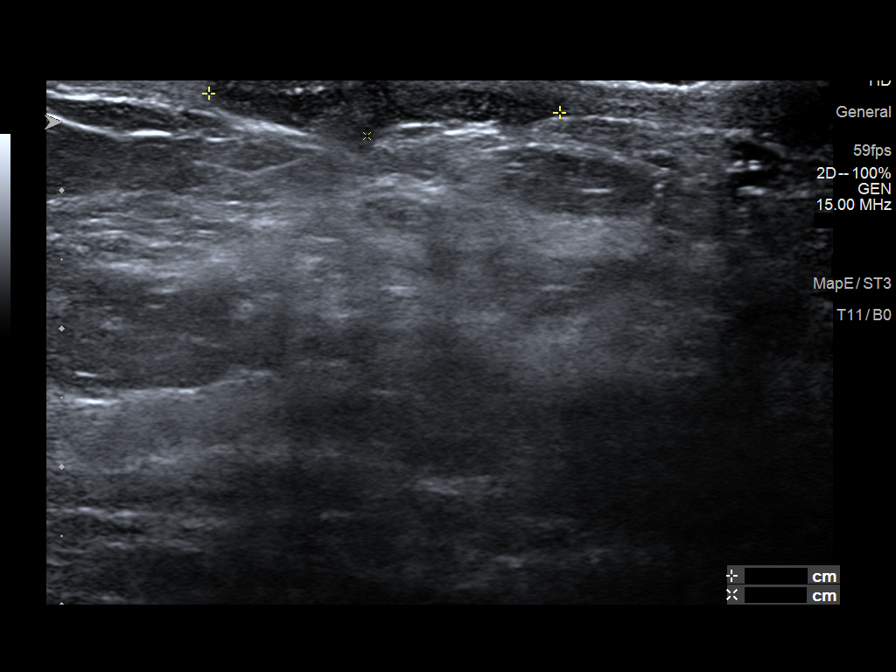
[im 3/9]
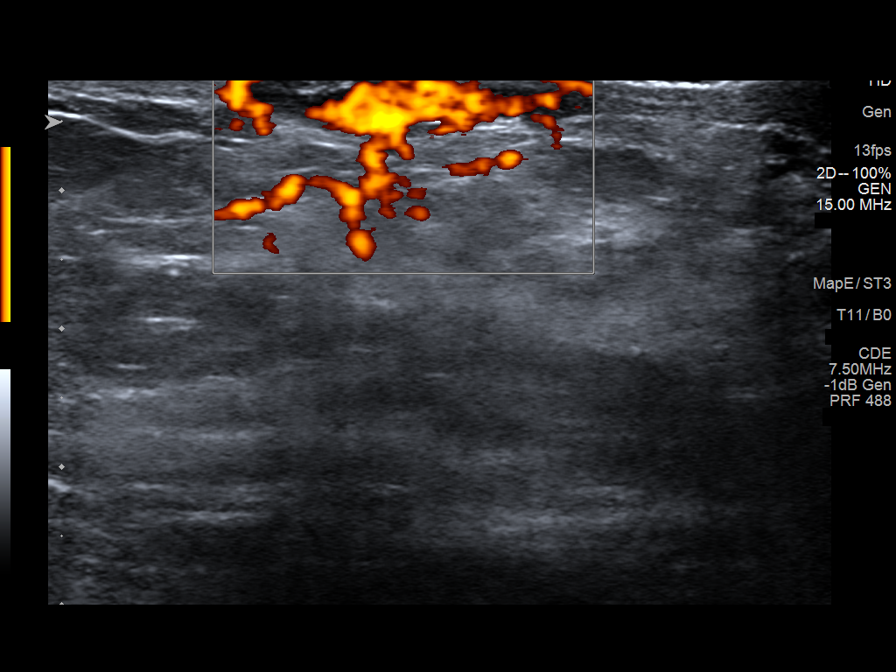
[im 4/9]
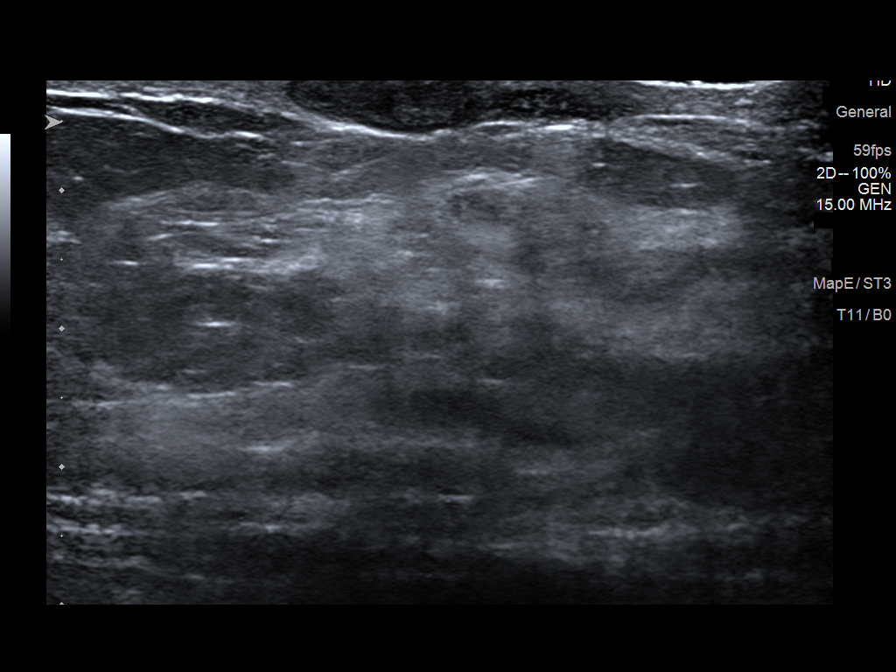
[im 5/9]
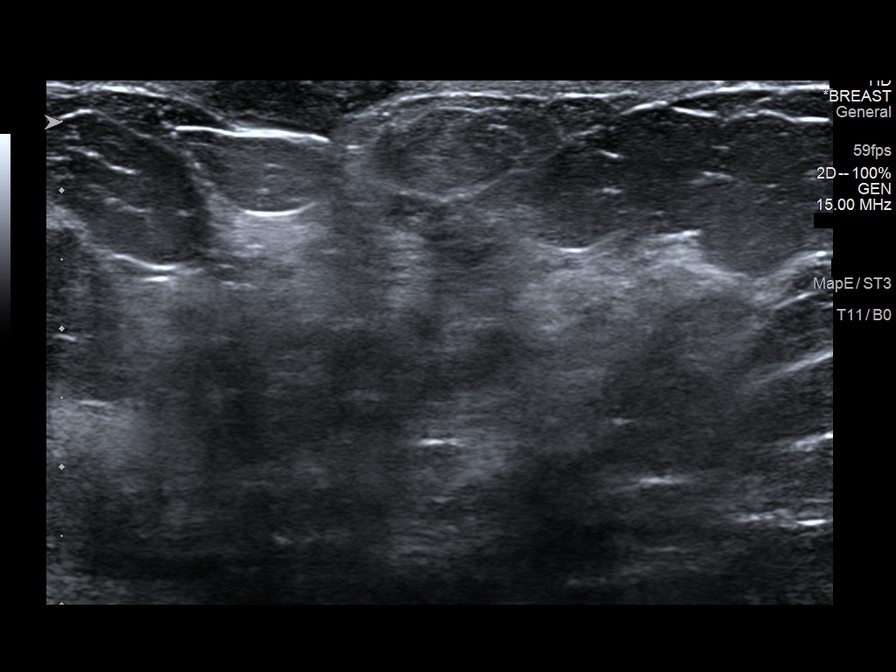
[im 6/9]
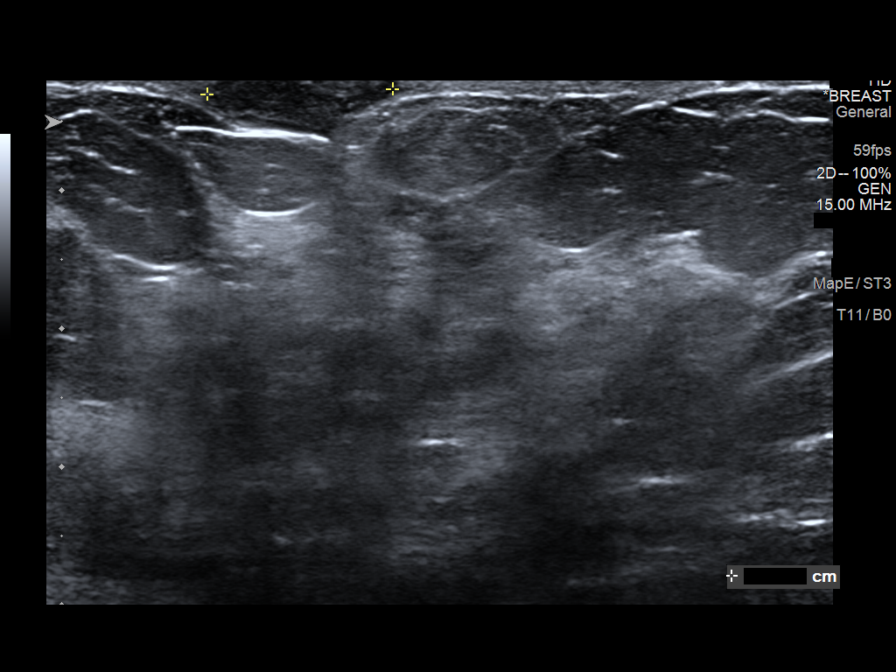
[im 7/9]
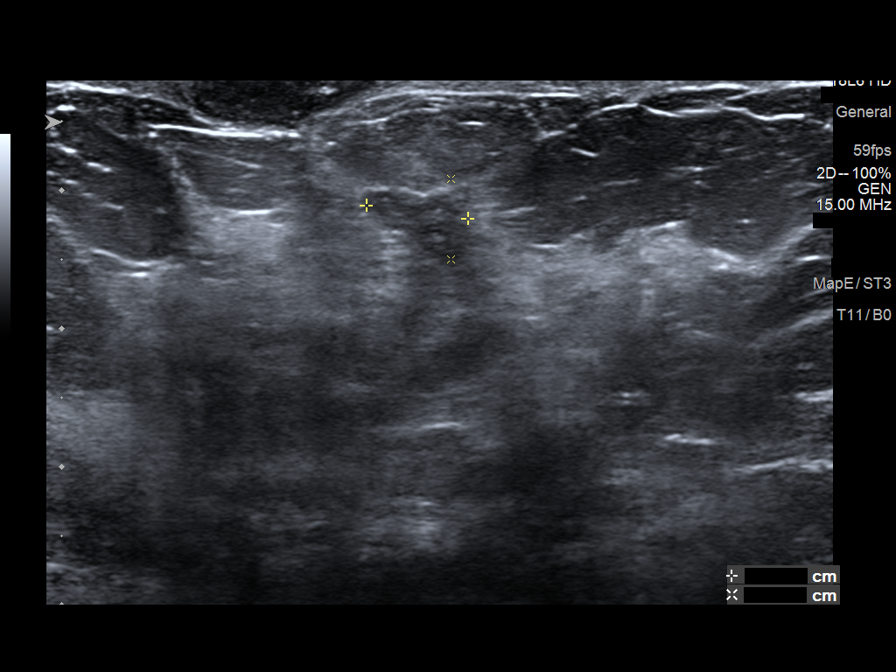
[im 8/9]
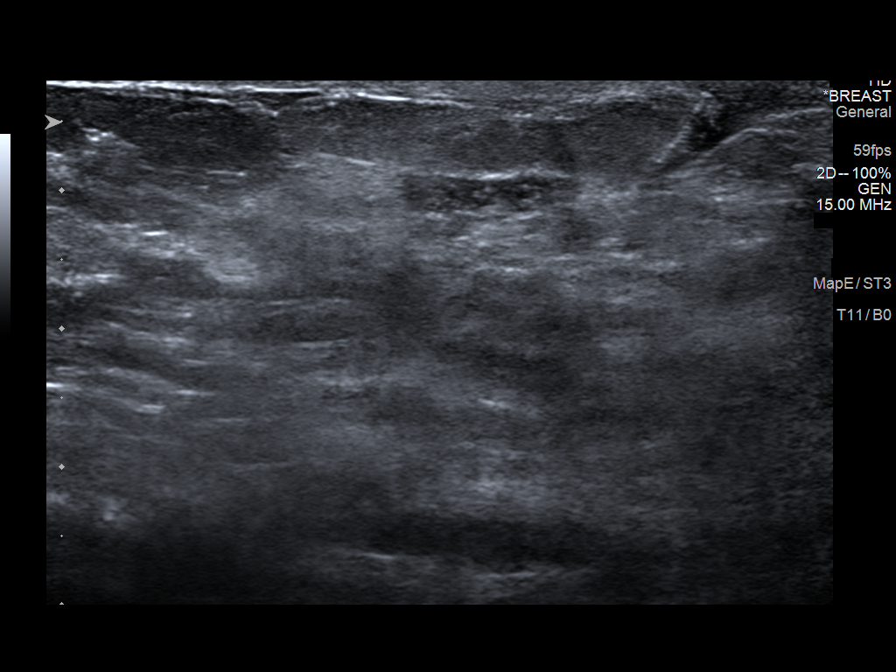
[im 9/9]
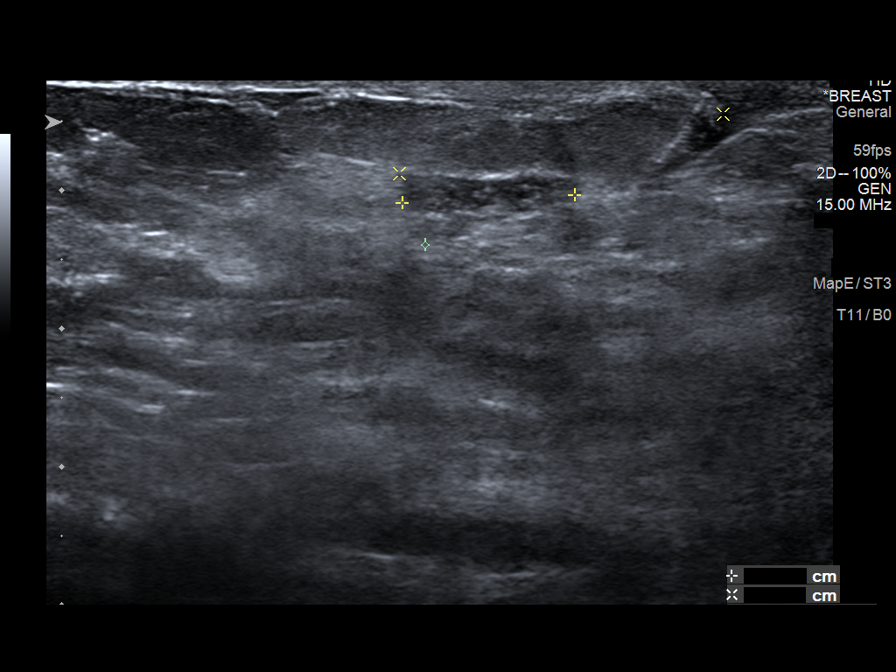

[9 of 9 positions shown; findings below may reference images not displayed]

FINDINGS: On physical exam, there is purplish discoloration focally spanning
approximately 1.5 cm in the skin of the left breast at 9 o'clock
position approximately 1 cm from the nipple, with mild slight darker
discoloration of the immediate surrounding skin. No erythema.

Targeted ultrasound is performed, showing focal skin thickening in
the 9 o'clock periareolar left breast, with an oblong oval complex
fluid collection with associated hyperemia at the 9 o'clock position
centered at 1 cm from the nipple. This cutaneous fluid collection or
phlegmon measures 2.5 x 0.5 x 1.3 cm. The subtending breast
parenchyma appears improved on today's ultrasound compared to the
most recent from December 23, 2018. Currently, there is a vague
hypoechoic area in the superficial breast parenchyma measuring
approximately 1.3 x 2.4 x 0.7 cm.

Previously on the ultrasound December 23, 2018, the hypoechoic area
in the breast parenchyma measured approximately 2.6 x 2.5 x 0.8 cm.

No drainable fluid collection within the breast parenchyma.
IMPRESSION: Improving left breast infection. Currently, there is mostly a dermal
component of the infection with complex fluid collection within the
skin of the 9 o'clock periareolar left breast as described above.
The hypoechoic tissue in the subtending breast parenchyma in this
region is definitely improving.

RECOMMENDATION:
Given patient's continued pain, and focal fluid
collection/inflammation in the skin, a course of Bactrim double
strength for 7 days was prescribed. She is being scheduled to return
in 1 week for follow-up left breast ultrasound.

I have discussed the findings and recommendations with the patient.
Results were also provided in writing at the conclusion of the
visit. If applicable, a reminder letter will be sent to the patient
regarding the next appointment.

BI-RADS CATEGORY  3: Probably benign.

## 2023-09-17 ENCOUNTER — Other Ambulatory Visit: Payer: Self-pay

## 2023-09-27 ENCOUNTER — Encounter: Payer: Self-pay | Admitting: Oncology

## 2023-09-27 ENCOUNTER — Inpatient Hospital Stay: Payer: Self-pay | Attending: Oncology | Admitting: Oncology

## 2023-09-27 ENCOUNTER — Inpatient Hospital Stay: Payer: Self-pay

## 2023-09-27 VITALS — BP 96/42 | HR 63 | Temp 98.0°F | Resp 16 | Wt 148.0 lb

## 2023-09-27 DIAGNOSIS — R42 Dizziness and giddiness: Secondary | ICD-10-CM | POA: Insufficient documentation

## 2023-09-27 DIAGNOSIS — F5089 Other specified eating disorder: Secondary | ICD-10-CM | POA: Insufficient documentation

## 2023-09-27 DIAGNOSIS — D509 Iron deficiency anemia, unspecified: Secondary | ICD-10-CM | POA: Insufficient documentation

## 2023-09-27 DIAGNOSIS — D500 Iron deficiency anemia secondary to blood loss (chronic): Secondary | ICD-10-CM

## 2023-09-27 DIAGNOSIS — N921 Excessive and frequent menstruation with irregular cycle: Secondary | ICD-10-CM | POA: Insufficient documentation

## 2023-09-27 LAB — CBC WITH DIFFERENTIAL/PLATELET
Abs Immature Granulocytes: 0.03 10*3/uL (ref 0.00–0.07)
Basophils Absolute: 0.1 10*3/uL (ref 0.0–0.1)
Basophils Relative: 1 %
Eosinophils Absolute: 0.3 10*3/uL (ref 0.0–0.5)
Eosinophils Relative: 5 %
HCT: 33.2 % — ABNORMAL LOW (ref 36.0–46.0)
Hemoglobin: 9.5 g/dL — ABNORMAL LOW (ref 12.0–15.0)
Immature Granulocytes: 1 %
Lymphocytes Relative: 27 %
Lymphs Abs: 1.8 10*3/uL (ref 0.7–4.0)
MCH: 22.1 pg — ABNORMAL LOW (ref 26.0–34.0)
MCHC: 28.6 g/dL — ABNORMAL LOW (ref 30.0–36.0)
MCV: 77.4 fL — ABNORMAL LOW (ref 80.0–100.0)
Monocytes Absolute: 0.4 10*3/uL (ref 0.1–1.0)
Monocytes Relative: 7 %
Neutro Abs: 3.9 10*3/uL (ref 1.7–7.7)
Neutrophils Relative %: 59 %
Platelets: 266 10*3/uL (ref 150–400)
RBC: 4.29 MIL/uL (ref 3.87–5.11)
RDW: 24.3 % — ABNORMAL HIGH (ref 11.5–15.5)
WBC: 6.6 10*3/uL (ref 4.0–10.5)
nRBC: 0 % (ref 0.0–0.2)

## 2023-09-27 LAB — SAMPLE TO BLOOD BANK

## 2023-09-27 MED ORDER — FERROUS SULFATE 325 (65 FE) MG PO TBEC: 325.0000 mg | DELAYED_RELEASE_TABLET | Freq: Three times a day (TID) | ORAL | 2 refills | Status: AC

## 2023-09-27 NOTE — Assessment & Plan Note (Signed)
Recent hemoglobin of 7.6, likely secondary to heavy menstrual cycles. History of IV iron infusion two years ago.   - Currently taking oral iron supplements once daily with vitamin C. Reports occasional dizziness with exertion and pica (craving for ice chips).  -Hemoglobin is better at 9.5 today, hematocrit 33.2, MCV 71.4.  White count and platelet count are within normal limits.  -Continue oral iron supplements and try to increase dose to up to 3 times a day if well-tolerated.  -Consider IV iron infusion, pending discussion with social worker regarding financial assistance.  Patient is currently self-pay.  -Plan for follow-up in two months to reassess hemoglobin levels.

## 2023-09-27 NOTE — Progress Notes (Signed)
[facility]  HEMATOLOGY CLINIC CONSULTATION NOTE    PATIENT NAME: Shelley Chen   MR#: [id number] DOB: Dec 12, 1990  DATE OF SERVICE: 09/27/2023  Patient Care Team: Sherren Mocha, MD as PCP - General (Family Medicine)  REASON FOR CONSULTATION/ CHIEF COMPLAINT:  Evaluation of anemia.  HISTORY OF PRESENT ILLNESS:  Shelley Chen is a 32 y.o. Hispanic lady with a past medical history of menometrorrhagia, was referred to our service for evaluation of microcytic anemia.    Discussed the use of AI scribe software for clinical note transcription with the patient, who gave verbal consent to proceed.   On 09/10/2023, labs showed hemoglobin of 7.6, hematocrit 27.8, MCV 73.  White count and platelet count were normal.  Ferritin of 2, iron decreased at 13, iron saturation decreased at 3%.  TSH was normal at 1.5.  CMP was unremarkable.  She was referred to hematology service and also GYN service for her iron deficiency anemia and menometrorrhagia respectively.  She has previously received intravenous iron infusions, with the last one being approximately two years ago. Currently, she is taking oral iron supplements once daily, along with vitamin C to aid absorption. She reports occasional dizziness with exertion and a craving for ice chips, both of which can be symptoms of iron deficiency anemia. She denies any other blood loss issues or symptoms such as chest pain or trouble breathing.   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   Seen at c-section    SURGICAL HISTORY: Past Surgical History:  Procedure Laterality Date   BREAST LUMPECTOMY Left 05/08/2019   Procedure: LEFT BREAST LUMPECTOMY;  Surgeon: Griselda Miner, MD;  Location:  S[facility]ERY [facility];  Service: General;  Laterality: Left;   CESAREAN SECTION     3 previous c sections   CESAREAN SECTION WITH BILATERAL TUBAL LIGATION Bilateral  04/30/2014   Procedure: REPEAT CESAREAN SECTION WITH BILATERAL TUBAL LIGATION;  Surgeon: Tereso Newcomer, MD;  Location: [facility];  Service: Obstetrics;  Laterality: Bilateral;   DILATION AND EVACUATION  11/12/2012   Procedure: DILATATION AND EVACUATION;  Surgeon: Reva Bores, MD;  Location: [facility];  Service: Gynecology;  Laterality: N/A;   LYSIS OF ADHESION N/A 04/30/2014   Procedure: LYSIS OF ADHESION;  Surgeon: Tereso Newcomer, MD;  Location: [facility];  Service: Obstetrics;  Laterality: N/A;   TUBAL LIGATION      SOCIAL HISTORY: She reports that she has never smoked. She has never used smokeless tobacco. She reports that she does not drink alcohol and does not use drugs. Social History   Socioeconomic History   Marital status: Single    Spouse name: Not on file   Number of children: 3   Years of education: Not on file   Highest education level: 10th grade  Occupational History   Not on file  Tobacco Use   Smoking status: Never   Smokeless tobacco: Never  Vaping Use   Vaping status: Never Used  Substance and Sexual Activity   Alcohol use: No   Drug use: No   Sexual activity: Yes    Birth control/protection: Surgical  Other Topics Concern   Not on file  Social History Narrative   Not on file   Social Drivers of Health   Financial Resource Strain: Not on file  Food Insecurity: Not on file  Transportation Needs: No Transportation Needs (12/10/2018)   PRAPARE - Administrator, Civil Service (Medical):  No    Lack of Transportation (Non-Medical): No  Physical Activity: Not on file  Stress: Not on file  Social Connections: Not on file  Intimate Partner Violence: Not on file    FAMILY HISTORY: No family history on file.  ALLERGIES:  She has no known allergies.  MEDICATIONS:  Current Outpatient Medications  Medication Sig Dispense Refill   ferrous sulfate 325 (65 FE) MG EC tablet Take 1 tablet (325 mg total) by mouth 3 (three) times daily with meals. 90 tablet  2   gabapentin (NEURONTIN) 100 MG capsule Take 1 capsule (100 mg total) by mouth at bedtime. (Patient not taking: Reported on 09/27/2023) 14 capsule 0   Multiple Vitamin (MULTIVITAMIN) tablet Take 1 tablet by mouth daily. (Patient not taking: Reported on 09/27/2023)     No current facility-administered medications for this visit.    REVIEW OF SYSTEMS:    Review of Systems - Oncology  All other pertinent systems were reviewed and were negative except as mentioned above.  PHYSICAL EXAMINATION:  ECOG PERFORMANCE STATUS: 1 - Symptomatic but completely ambulatory  Vitals:   09/27/23 1405  BP: (!) 96/42  Pulse: 63  Resp: 16  Temp: 98 F (36.7 C)  SpO2: 100%   Filed Weights   09/27/23 1405  Weight: 148 lb (67.1 kg)    Physical Exam Constitutional:      General: She is not in acute distress.    Appearance: Normal appearance.  HENT:     Head: Normocephalic and atraumatic.  Eyes:     General: No scleral icterus.    Conjunctiva/sclera: Conjunctivae normal.  Cardiovascular:     Rate and Rhythm: Normal rate and regular rhythm.     Heart sounds: Normal heart sounds.  Pulmonary:     Effort: Pulmonary effort is normal.     Breath sounds: Normal breath sounds.  Abdominal:     General: There is no distension.  Musculoskeletal:     Right lower leg: No edema.     Left lower leg: No edema.  Neurological:     General: No focal deficit present.     Mental Status: She is alert and oriented to person, place, and time.  Psychiatric:        Mood and Affect: Mood normal.        Behavior: Behavior normal.        Thought Content: Thought content normal.     LABORATORY DATA:   I have reviewed the data as listed.  Results for orders placed or performed in visit on 09/27/23  CBC with Differential/Platelet  Result Value Ref Range   WBC 6.6 4.0 - 10.5 K/uL   RBC 4.29 3.87 - 5.11 MIL/uL   Hemoglobin 9.5 (L) 12.0 - 15.0 g/dL   HCT 18.8 (L) 41.6 - 60.6 %   MCV 77.4 (L) 80.0 - 100.0  fL   MCH 22.1 (L) 26.0 - 34.0 pg   MCHC 28.6 (L) 30.0 - 36.0 g/dL   RDW 30.1 (H) 60.1 - 09.3 %   Platelets 266 150 - 400 K/uL   nRBC 0.0 0.0 - 0.2 %   Neutrophils Relative % 59 %   Neutro Abs 3.9 1.7 - 7.7 K/uL   Lymphocytes Relative 27 %   Lymphs Abs 1.8 0.7 - 4.0 K/uL   Monocytes Relative 7 %   Monocytes Absolute 0.4 0.1 - 1.0 K/uL   Eosinophils Relative 5 %   Eosinophils Absolute 0.3 0.0 - 0.5 K/uL   Basophils Relative 1 %  Basophils Absolute 0.1 0.0 - 0.1 K/uL   Immature Granulocytes 1 %   Abs Immature Granulocytes 0.03 0.00 - 0.07 K/uL  Results for orders placed or performed in visit on 09/27/23  Sample to Blood Bank  Result Value Ref Range   Blood Bank Specimen SAMPLE AVAILABLE FOR TESTING    Sample Expiration      09/30/2023,2359 Performed at [facility], [address], [place], [place] [address]       RADIOGRAPHIC STUDIES:  No pertinent imaging studies available to review.   ASSESSMENT & PLAN:   32 y.o. Hispanic lady with a past medical history of menometrorrhagia, was referred to our service for evaluation of microcytic anemia.    Iron deficiency anemia Recent hemoglobin of 7.6, likely secondary to heavy menstrual cycles. History of IV iron infusion two years ago.   - Currently taking oral iron supplements once daily with vitamin C. Reports occasional dizziness with exertion and pica (craving for ice chips).  -Hemoglobin is better at 9.5 today, hematocrit 33.2, MCV 71.4.  White count and platelet count are within normal limits.  -Continue oral iron supplements and try to increase dose to up to 3 times a day if well-tolerated.  -Consider IV iron infusion, pending discussion with social worker regarding financial assistance.  Patient is currently self-pay.  -Plan for follow-up in two months to reassess hemoglobin levels.   Since the cause of anemia seems to be obvious from iron deficiency, I am not pursuing extensive workup at this  time.  If inadequate response to IV iron is noted, we will pursue workup to rule out other etiologies.  Orders Placed This Encounter  Procedures   CBC with Differential/Platelet    Standing Status:   Future    Number of Occurrences:   1    Expiration Date:   09/26/2024   Sample to Blood Bank    I spent a total of 40 minutes during this encounter with the patient including review of chart and various tests results, discussions about plan of care and coordination of care plan.  I reviewed lab results and outside records for this visit and discussed relevant results with the patient. Diagnosis, plan of care and treatment options were also discussed in detail with the patient. Opportunity provided to ask questions and answers provided to her apparent satisfaction. Provided instructions to call our clinic with any problems, questions or concerns prior to return visit. I recommended to continue follow-up with PCP and sub-specialists. She verbalized understanding and agreed with the plan. No barriers to learning was detected.   Future Appointments  Date Time Provider Department Center  11/28/2023 10:30 AM [facility] None  11/28/2023 11:00 AM Winni Ehrhard, Archie Patten, MD [facility] None     Meryl Crutch, MD [facility] - A DEPT OF [facility] [address] [place] [address] Dept: [phone] Dept Fax: [phone]  09/27/2023 4:03 PM   This document was completed utilizing speech recognition software. Grammatical errors, random word insertions, pronoun errors, and incomplete sentences are an occasional consequence of this system due to software limitations, ambient noise, and hardware issues. Any formal questions or concerns about the content, text or information contained within the body of this dictation should be directly addressed to the provider for clarification.

## 2023-09-28 ENCOUNTER — Telehealth: Payer: Self-pay

## 2023-09-28 ENCOUNTER — Other Ambulatory Visit: Payer: Self-pay

## 2023-09-28 DIAGNOSIS — D500 Iron deficiency anemia secondary to blood loss (chronic): Secondary | ICD-10-CM

## 2023-09-28 NOTE — Telephone Encounter (Signed)
[facility] Clinical Social Work  Clinical Social Work was referred by nurse for assessment of psychosocial needs.  Clinical Social Worker attempted to contact patient by phone to offer support and assess for needs.  CSW unable to reach -patient/  Marguerita Merles, LCSW  Clinical Social Worker [facility]

## 2023-09-28 NOTE — Telephone Encounter (Signed)
[facility] Clinical Social Work  Clinical Social Work was referred by nurse for assessment of psychosocial needs.  Clinical Social Worker attempted to contact patient by phone to offer support and assess for needs.  No answer, no vm left at this time.  Marguerita Merles, LCSW  Clinical Social Worker [facility]

## 2023-09-28 NOTE — Telephone Encounter (Signed)
Called patient and relay message below as per Dr. Arlana Pouch. Patient voiced full understanding. Reminded patient that she will be hearing from SW some time next week.  Her hemoglobin is 9.5 today. No need for blood transfusion. Please inform patient.   Avinash Pasam

## 2023-10-01 ENCOUNTER — Other Ambulatory Visit: Payer: Self-pay

## 2023-10-01 ENCOUNTER — Inpatient Hospital Stay: Payer: Self-pay

## 2023-10-01 NOTE — Progress Notes (Signed)
[facility] Clinical Social Work  Clinical Social Work was referred by nurse for assessment of psychosocial needs.  Clinical Social Worker contacted patient by phone via interpreter to offer support and assess for needs. Patient expressed primary need with resources to assist with medical appointment cost, due to her being self pay. CSW referred patient to [employer] and [employer] for assistance with applying for the [employer]. Unfortunately, there are limited resources to assist with cost of iron infusions.  Marguerita Merles, LCSW  Clinical Social Worker [facility]

## 2023-11-27 ENCOUNTER — Other Ambulatory Visit: Payer: Self-pay | Admitting: Oncology

## 2023-11-27 ENCOUNTER — Other Ambulatory Visit: Payer: Self-pay

## 2023-11-27 DIAGNOSIS — D509 Iron deficiency anemia, unspecified: Secondary | ICD-10-CM

## 2023-11-27 DIAGNOSIS — D500 Iron deficiency anemia secondary to blood loss (chronic): Secondary | ICD-10-CM

## 2023-11-28 ENCOUNTER — Inpatient Hospital Stay: Payer: Self-pay

## 2023-11-28 ENCOUNTER — Telehealth: Payer: Self-pay

## 2023-11-28 ENCOUNTER — Inpatient Hospital Stay: Payer: Self-pay | Admitting: Oncology

## 2023-11-28 NOTE — Telephone Encounter (Signed)
Patient missed her appointment today and wanted to know who scheduled this appointment. Patient was gently made aware that it was made between herself and the scheduler during her last visit. Patient does not feel she needs an appointment. Dr. Arlana Pouch made aware.

## 2024-05-09 ENCOUNTER — Ambulatory Visit (HOSPITAL_COMMUNITY)
Admission: EM | Admit: 2024-05-09 | Discharge: 2024-05-09 | Disposition: A | Discharge status: Home / Self Care | Payer: Self-pay | Attending: Emergency Medicine | Admitting: Emergency Medicine

## 2024-05-09 ENCOUNTER — Encounter (HOSPITAL_COMMUNITY): Payer: Self-pay | Admitting: *Deleted

## 2024-05-09 DIAGNOSIS — G629 Polyneuropathy, unspecified: Secondary | ICD-10-CM

## 2024-05-09 MED ORDER — GABAPENTIN 300 MG PO CAPS: 300.0000 mg | ORAL_CAPSULE | Freq: Every evening | ORAL | 0 refills | Status: AC | PRN

## 2024-05-09 NOTE — Discharge Instructions (Addendum)
 You can take 1 capsule of gabapentin  once daily at bedtime for nerve related pain. Follow-up with the primary care provider that you are established with today for further evaluation and management of this chronic issue.

## 2024-05-09 NOTE — ED Provider Notes (Signed)
 [facility]    CSN: [id number] Arrival date & time: 05/09/24  0813      History   Chief Complaint Chief Complaint  Patient presents with   Leg Pain    HPI Shelley Chen is a 33 y.o. female.   Patient presents with bilateral leg pain, numbness, and burning sensation for a few years.  Patient states that it has worsened over the last year.  Patient states that she has been seen for this before and was diagnosed with neuropathy.  Patient states that she did take gabapentin  in the past with relief, but did not like taking this because it did make her drowsy.  Patient states that she is not having similar symptoms to both of her hands over the last 3 weeks as well.  Patient states that she does not currently have a primary care doctor would like to be established with one  to be further evaluated for her neuropathy.  Patient denies any recent falls or injuries.  Patient denies history of diabetes.  Patient does have a history of iron deficiency anemia.  The history is provided by the patient and medical records.  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   Language barrier 04/28/2019   Iron deficiency anemia 07/01/2013    Past Surgical History:  Procedure Laterality Date   BREAST LUMPECTOMY Left 05/08/2019   Procedure: LEFT BREAST LUMPECTOMY;  Surgeon: Curvin Deward MOULD, MD;  Location: [facility];  Service: General;  Laterality: Left;   CESAREAN SECTION     3 previous c sections   CESAREAN SECTION WITH BILATERAL TUBAL LIGATION Bilateral 04/30/2014   Procedure: REPEAT CESAREAN SECTION WITH BILATERAL TUBAL LIGATION;  Surgeon: Gloris DELENA Hugger, MD;  Location: [facility];  Service: Obstetrics;  Laterality: Bilateral;   DILATION AND EVACUATION  11/12/2012   Procedure: DILATATION AND EVACUATION;  Surgeon:  Glenys GORMAN Birk, MD;  Location: [facility];  Service: Gynecology;  Laterality: N/A;   LYSIS OF ADHESION N/A 04/30/2014   Procedure: LYSIS OF ADHESION;  Surgeon: Gloris DELENA Hugger, MD;  Location: [facility];  Service: Obstetrics;  Laterality: N/A;   TUBAL LIGATION      OB History     Gravida  4   Para  3   Term  2   Preterm      AB  1   Living  3      SAB      IAB      Ectopic  1   Multiple      Live Births  3            Home Medications    Prior to Admission medications   Medication Sig Start Date End Date Taking? Authorizing Provider  gabapentin  (NEURONTIN ) 300 MG capsule Take 1 capsule (300 mg total) by mouth at bedtime as needed. 05/09/24  Yes Johnie Flaming A, NP  ferrous sulfate  325 (65 FE) MG EC tablet Take 1 tablet (325 mg total) by mouth 3 (three) times daily with meals. 09/27/23   Pasam, Chinita, MD  Multiple Vitamin (MULTIVITAMIN) tablet Take 1 tablet by mouth daily. Patient not taking: Reported on 09/27/2023    [provider]    Family History History reviewed. No pertinent family history.  Social History Social History   Tobacco  Use   Smoking status: Never   Smokeless tobacco: Never  Vaping Use   Vaping status: Never Used  Substance Use Topics   Alcohol use: No   Drug use: No     Allergies   Patient has no known allergies.   Review of Systems Review of Systems  Per HPI  Physical Exam Triage Vital Signs ED Triage Vitals  Encounter Vitals Group     BP 05/09/24 0836 (!) 103/58     Girls Systolic BP Percentile --      Girls Diastolic BP Percentile --      Boys Systolic BP Percentile --      Boys Diastolic BP Percentile --      Pulse Rate 05/09/24 0836 67     Resp 05/09/24 0836 18     Temp 05/09/24 0836 98.2 F (36.8 C)     Temp Source 05/09/24 0836 Oral     SpO2 05/09/24 0836 97 %     Weight --      Height --      Head Circumference --      Peak Flow --      Pain Score 05/09/24 0833 10     Pain Loc --      Pain  Education --      Exclude from Growth Chart --    No data found.  Updated Vital Signs BP (!) 103/58 (BP Location: Right Arm)   Pulse 67   Temp 98.2 F (36.8 C) (Oral)   Resp 18   LMP 05/08/2024 (Exact Date)   SpO2 97%   Visual Acuity Right Eye Distance:   Left Eye Distance:   Bilateral Distance:    Right Eye Near:   Left Eye Near:    Bilateral Near:     Physical Exam Vitals and nursing note reviewed.  Constitutional:      General: She is awake. She is not in acute distress.    Appearance: Normal appearance. She is well-developed and well-groomed. She is not ill-appearing.  Musculoskeletal:     Right hand: Normal. No swelling, deformity or tenderness. Normal range of motion. Normal strength. Normal capillary refill.     Left hand: Normal. No swelling, deformity or tenderness. Normal range of motion. Normal strength. Normal capillary refill.     Right lower leg: Normal.     Left lower leg: Normal.     Right ankle: Normal.     Left ankle: Normal.     Right foot: Normal. Normal range of motion and normal capillary refill. No swelling, deformity or tenderness.     Left foot: Normal. Normal range of motion and normal capillary refill. No swelling, deformity or tenderness.  Skin:    General: Skin is warm and dry.  Neurological:     Mental Status: She is alert.  Psychiatric:        Behavior: Behavior is cooperative.      UC Treatments / Results  Labs (all labs ordered are listed, but only abnormal results are displayed) Labs Reviewed - No data to display  EKG   Radiology No results found.  Procedures Procedures (including critical care time)  Medications Ordered in UC Medications - No data to display  Initial Impression / Assessment and Plan / UC Course  I have reviewed the triage vital signs and the nursing notes.  Pertinent labs & imaging results that were available during my care of the patient were reviewed by me and considered in my medical decision  making (see chart for details).     Patient is overall well-appearing.  Vitals are stable.  No significant findings on exam.  Extremities nontender, without evidence of swelling, without decreased range of motion, or decreased capillary refill.  Prescribed gabapentin  to take once at bedtime as needed for neuropathic pain.  Establish patient with primary care provider in clinic today to further evaluate and manage this.  Discussed follow-up and return precautions. Final Clinical Impressions(s) / UC Diagnoses   Final diagnoses:  Neuropathy     Discharge Instructions      You can take 1 capsule of gabapentin  once daily at bedtime for nerve related pain. Follow-up with the primary care provider that you are established with today for further evaluation and management of this chronic issue.   ED Prescriptions     Medication Sig Dispense Auth. Provider   gabapentin  (NEURONTIN ) 300 MG capsule Take 1 capsule (300 mg total) by mouth at bedtime as needed. 30 capsule Johnie Flaming A, NP      PDMP not reviewed this encounter.   Johnie Flaming A, NP 05/09/24 [phone]

## 2024-05-09 NOTE — ED Triage Notes (Signed)
 Pt states that she has bilateral leg pain, numbness, and burning X multiple years but worse the last year. She also states it hurts to walk and even put shoes on. She is now having this same pain and numbness in both hands X 3 weeks. She isnt taking nay meds for her sx and states last time she was seen was here at [facility].   She was given gabapentin  in the past but she did not take it since it caused her to be sleepy.

## 2024-05-12 ENCOUNTER — Encounter: Payer: Self-pay | Admitting: Family Medicine

## 2024-05-12 ENCOUNTER — Ambulatory Visit (INDEPENDENT_AMBULATORY_CARE_PROVIDER_SITE_OTHER): Payer: Self-pay | Admitting: Family Medicine

## 2024-05-12 VITALS — BP 105/69 | HR 64 | Ht 65.0 in | Wt 157.2 lb

## 2024-05-12 DIAGNOSIS — G629 Polyneuropathy, unspecified: Secondary | ICD-10-CM

## 2024-05-12 DIAGNOSIS — Z7689 Persons encountering health services in other specified circumstances: Secondary | ICD-10-CM

## 2024-05-12 DIAGNOSIS — Z603 Acculturation difficulty: Secondary | ICD-10-CM

## 2024-05-12 DIAGNOSIS — M2550 Pain in unspecified joint: Secondary | ICD-10-CM

## 2024-05-12 DIAGNOSIS — D649 Anemia, unspecified: Secondary | ICD-10-CM

## 2024-05-12 DIAGNOSIS — Z789 Other specified health status: Secondary | ICD-10-CM

## 2024-05-12 MED ORDER — MELOXICAM 15 MG PO TABS: 15.0000 mg | ORAL_TABLET | Freq: Every day | ORAL | 3 refills | Status: AC

## 2024-05-12 NOTE — Progress Notes (Unsigned)
 New Patient Office Visit  Subjective    Patient ID: Shelley Chen, female    DOB: 03-25-1991  Age: 33 y.o. MRN: [id number]  CC:  Chief Complaint  Patient presents with   Establish Care    Pt reports she has a lot of pain in her legs that is now going to her hands.     HPI Shelley Chen presents to establish care and with complaint of pain in her hands and feet. The pain in her feet has been for about a year and is most noticeable when she stands for a long time during the day. Patient has taken neurontin  in the past for sx but it was making her drowsy. This visit was aided by an interpreter.    Outpatient Encounter Medications as of 05/12/2024  Medication Sig   ferrous sulfate  325 (65 FE) MG EC tablet Take 1 tablet (325 mg total) by mouth 3 (three) times daily with meals.   meloxicam  (MOBIC ) 15 MG tablet Take 1 tablet (15 mg total) by mouth daily.   gabapentin  (NEURONTIN ) 300 MG capsule Take 1 capsule (300 mg total) by mouth at bedtime as needed. (Patient not taking: Reported on 05/12/2024)   Multiple Vitamin (MULTIVITAMIN) tablet Take 1 tablet by mouth daily. (Patient not taking: Reported on 05/12/2024)   No facility-administered encounter medications on file as of 05/12/2024.    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   Seen at c-section    Past Surgical History:  Procedure Laterality Date   BREAST LUMPECTOMY Left 05/08/2019   Procedure: LEFT BREAST LUMPECTOMY;  Surgeon: Curvin Deward MOULD, MD;  Location: [facility];  Service: General;  Laterality: Left;   CESAREAN SECTION     3 previous c sections   CESAREAN SECTION WITH BILATERAL TUBAL LIGATION Bilateral 04/30/2014   Procedure: REPEAT CESAREAN SECTION WITH BILATERAL TUBAL LIGATION;  Surgeon: Gloris DELENA Hugger, MD;  [facility];  Service: Obstetrics;  Laterality: Bilateral;   DILATION AND EVACUATION  11/12/2012   Procedure: DILATATION AND EVACUATION;  Surgeon: Glenys GORMAN Birk, MD;  Location[facility];  Service: Gynecology;  Laterality: N/A;   LYSIS OF ADHESION N/A 04/30/2014   Procedure: LYSIS OF ADHESION;  Surgeon: Gloris DELENA Hugger, MD;  Location[facility];  Service: Obstetrics;  Laterality: N/A;   TUBAL LIGATION      History reviewed. No pertinent family history.  Social History   Socioeconomic History   Marital status: Single    Spouse name: Not on file   Number of children: 3   Years of education: Not on file   Highest education level: 10th grade  Occupational History   Not on file  Tobacco Use   Smoking status: Never   Smokeless tobacco: Never  Vaping Use   Vaping status: Never Used  Substance and Sexual Activity   Alcohol use: No   Drug use: No   Sexual activity: Yes    Birth control/protection: Surgical  Other Topics Concern   Not on file  Social History Narrative   Not on file   Social Drivers of Health   Financial Resource Strain: Not on file  Food Insecurity: No Food Insecurity (05/12/2024)   Hunger Vital Sign    Worried About Running Out of Food in the Last Year: Never true    Ran Out of Food in the Last Year: Never true  Transportation Needs: No Transportation Needs (05/12/2024)   PRAPARE - Transportation  Lack of Transportation (Medical): No    Lack of Transportation (Non-Medical): No  Physical Activity: Not on file  Stress: Not on file  Social Connections: Not on file  Intimate Partner Violence: Not At Risk (05/12/2024)   Humiliation, Afraid, Rape, and Kick questionnaire    Fear of Current or Ex-Partner: No    Emotionally Abused: No    Physically Abused: No    Sexually Abused: No    Review of Systems  Musculoskeletal:  Positive for joint pain.  All other systems reviewed and are negative.       Objective   BP 105/69   Pulse 64   Ht 5' 5 (1.651 m)   Wt 157 lb 3.2 oz (71.3 kg)   LMP 05/08/2024 (Exact Date)   SpO2 98%   BMI 26.16 kg/m   Physical Exam Vitals and nursing note reviewed.  Constitutional:       General: She is not in acute distress. Cardiovascular:     Rate and Rhythm: Normal rate and regular rhythm.  Pulmonary:     Effort: Pulmonary effort is normal.     Breath sounds: Normal breath sounds.  Abdominal:     Palpations: Abdomen is soft.     Tenderness: There is no abdominal tenderness.  Musculoskeletal:        General: Tenderness (multiple joints in hands and feet) present.  Neurological:     General: No focal deficit present.     Mental Status: She is alert and oriented to person, place, and time.         Assessment & Plan:   Arthralgia, unspecified joint  Neuropathy  Anemia, unspecified type -     CBC with Differential/Platelet -     Iron, TIBC and Ferritin Panel  Language barrier to communication  Encounter to establish care  Other orders -     Meloxicam ; Take 1 tablet (15 mg total) by mouth daily.  Dispense: 30 tablet; Refill: 3     No follow-ups on file.   Tanda Raguel SQUIBB, MD

## 2024-05-13 LAB — CBC WITH DIFFERENTIAL/PLATELET
Basophils Absolute: 0.1 x10E3/uL (ref 0.0–0.2)
Basos: 1 %
EOS (ABSOLUTE): 0.3 x10E3/uL (ref 0.0–0.4)
Eos: 6 %
Hematocrit: 39.4 % (ref 34.0–46.6)
Hemoglobin: 11.9 g/dL (ref 11.1–15.9)
Immature Grans (Abs): 0.1 x10E3/uL (ref 0.0–0.1)
Immature Granulocytes: 1 %
Lymphocytes Absolute: 1.3 x10E3/uL (ref 0.7–3.1)
Lymphs: 24 %
MCH: 25.6 pg — ABNORMAL LOW (ref 26.6–33.0)
MCHC: 30.2 g/dL — ABNORMAL LOW (ref 31.5–35.7)
MCV: 85 fL (ref 79–97)
Monocytes Absolute: 0.3 x10E3/uL (ref 0.1–0.9)
Monocytes: 6 %
Neutrophils Absolute: 3.3 x10E3/uL (ref 1.4–7.0)
Neutrophils: 62 %
Platelets: 245 x10E3/uL (ref 150–450)
RBC: 4.65 x10E6/uL (ref 3.77–5.28)
RDW: 14.1 % (ref 11.7–15.4)
WBC: 5.3 x10E3/uL (ref 3.4–10.8)

## 2024-05-13 LAB — IRON,TIBC AND FERRITIN PANEL
Ferritin: 46 ng/mL (ref 15–150)
Iron Saturation: 31 % (ref 15–55)
Iron: 111 ug/dL (ref 27–159)
Total Iron Binding Capacity: 361 ug/dL (ref 250–450)
UIBC: 250 ug/dL (ref 131–425)

## 2024-05-14 ENCOUNTER — Ambulatory Visit: Payer: Self-pay | Admitting: Family Medicine

## 2024-05-14 ENCOUNTER — Encounter: Payer: Self-pay | Admitting: Family Medicine
